# Patient Record
Sex: Female | Born: 1945 | Race: White | Hispanic: No
Health system: Southern US, Community
[De-identification: ages and names within clinical notes are randomized; demographics above are authoritative.]

---

## 2004-10-27 ENCOUNTER — Ambulatory Visit: Payer: Self-pay | Admitting: Internal Medicine

## 2004-11-08 ENCOUNTER — Ambulatory Visit: Payer: Self-pay | Admitting: Internal Medicine

## 2004-11-10 ENCOUNTER — Inpatient Hospital Stay (HOSPITAL_COMMUNITY): Admission: AD | Admit: 2004-11-10 | Discharge: 2004-11-11 | Payer: Self-pay | Admitting: Internal Medicine

## 2004-11-10 ENCOUNTER — Ambulatory Visit: Payer: Self-pay | Admitting: Internal Medicine

## 2004-12-04 ENCOUNTER — Ambulatory Visit: Payer: Self-pay

## 2004-12-11 ENCOUNTER — Ambulatory Visit (HOSPITAL_COMMUNITY): Admission: RE | Admit: 2004-12-11 | Discharge: 2004-12-12 | Payer: Self-pay | Admitting: Internal Medicine

## 2004-12-11 ENCOUNTER — Ambulatory Visit: Payer: Self-pay | Admitting: Internal Medicine

## 2004-12-20 ENCOUNTER — Ambulatory Visit: Payer: Self-pay

## 2005-01-29 ENCOUNTER — Ambulatory Visit: Payer: Self-pay

## 2005-03-04 ENCOUNTER — Ambulatory Visit: Payer: Self-pay | Admitting: Internal Medicine

## 2005-03-06 ENCOUNTER — Ambulatory Visit: Payer: Self-pay | Admitting: Internal Medicine

## 2005-08-03 ENCOUNTER — Ambulatory Visit: Payer: Self-pay

## 2005-08-21 ENCOUNTER — Ambulatory Visit: Payer: Self-pay | Admitting: Family Medicine

## 2005-08-28 ENCOUNTER — Ambulatory Visit: Payer: Self-pay | Admitting: Family Medicine

## 2005-10-01 ENCOUNTER — Ambulatory Visit: Payer: Self-pay | Admitting: Internal Medicine

## 2006-02-26 ENCOUNTER — Ambulatory Visit: Payer: Self-pay | Admitting: Internal Medicine

## 2006-03-22 ENCOUNTER — Ambulatory Visit: Payer: Self-pay | Admitting: General Surgery

## 2006-05-30 ENCOUNTER — Ambulatory Visit: Payer: Self-pay | Admitting: Internal Medicine

## 2007-05-14 ENCOUNTER — Emergency Department: Payer: Self-pay | Admitting: Emergency Medicine

## 2007-05-14 ENCOUNTER — Other Ambulatory Visit: Payer: Self-pay

## 2007-12-02 ENCOUNTER — Emergency Department: Payer: Self-pay | Admitting: Emergency Medicine

## 2008-01-19 ENCOUNTER — Ambulatory Visit: Payer: Self-pay | Admitting: Family Medicine

## 2008-02-18 ENCOUNTER — Ambulatory Visit: Payer: Self-pay | Admitting: Gastroenterology

## 2008-06-01 ENCOUNTER — Ambulatory Visit: Payer: Self-pay | Admitting: Internal Medicine

## 2008-06-05 ENCOUNTER — Emergency Department: Payer: Self-pay | Admitting: Emergency Medicine

## 2008-09-18 ENCOUNTER — Emergency Department: Payer: Self-pay | Admitting: Emergency Medicine

## 2010-01-15 ENCOUNTER — Emergency Department: Payer: Self-pay | Admitting: Internal Medicine

## 2010-02-01 ENCOUNTER — Ambulatory Visit: Payer: Self-pay | Admitting: Cardiology

## 2010-02-03 ENCOUNTER — Inpatient Hospital Stay: Payer: Self-pay | Admitting: Cardiology

## 2010-06-08 ENCOUNTER — Ambulatory Visit: Payer: Self-pay | Admitting: Otolaryngology

## 2010-06-20 ENCOUNTER — Ambulatory Visit: Payer: Self-pay | Admitting: Otolaryngology

## 2010-06-22 ENCOUNTER — Ambulatory Visit: Payer: Self-pay | Admitting: Otolaryngology

## 2010-06-23 LAB — PATHOLOGY REPORT

## 2010-09-17 ENCOUNTER — Inpatient Hospital Stay: Payer: Self-pay | Admitting: Internal Medicine

## 2011-04-30 ENCOUNTER — Emergency Department: Payer: Self-pay | Admitting: Emergency Medicine

## 2011-07-12 ENCOUNTER — Emergency Department: Payer: Self-pay | Admitting: Emergency Medicine

## 2011-07-12 LAB — BASIC METABOLIC PANEL
BUN: 13 mg/dL (ref 7–18)
Creatinine: 0.84 mg/dL (ref 0.60–1.30)
Glucose: 103 mg/dL — ABNORMAL HIGH (ref 65–99)
Potassium: 4.1 mmol/L (ref 3.5–5.1)
Sodium: 141 mmol/L (ref 136–145)

## 2011-07-12 LAB — CBC
MCV: 86 fL (ref 80–100)
Platelet: 289 10*3/uL (ref 150–440)
RDW: 16.7 % — ABNORMAL HIGH (ref 11.5–14.5)
WBC: 8.1 10*3/uL (ref 3.6–11.0)

## 2011-11-11 ENCOUNTER — Emergency Department: Payer: Self-pay | Admitting: Internal Medicine

## 2011-11-11 LAB — CK TOTAL AND CKMB (NOT AT ARMC)
CK, Total: 31 U/L (ref 21–215)
CK-MB: 0.8 ng/mL (ref 0.5–3.6)

## 2011-11-11 LAB — COMPREHENSIVE METABOLIC PANEL
Albumin: 3.2 g/dL — ABNORMAL LOW (ref 3.4–5.0)
Anion Gap: 6 — ABNORMAL LOW (ref 7–16)
Bilirubin,Total: 0.2 mg/dL (ref 0.2–1.0)
Co2: 30 mmol/L (ref 21–32)
Osmolality: 273 (ref 275–301)
Total Protein: 7.1 g/dL (ref 6.4–8.2)

## 2011-11-11 LAB — CBC
HCT: 32.4 % — ABNORMAL LOW (ref 35.0–47.0)
MCHC: 31.1 g/dL — ABNORMAL LOW (ref 32.0–36.0)
MCV: 82 fL (ref 80–100)
Platelet: 286 10*3/uL (ref 150–440)
RBC: 3.94 10*6/uL (ref 3.80–5.20)

## 2011-11-11 LAB — PRO B NATRIURETIC PEPTIDE: B-Type Natriuretic Peptide: 36 pg/mL (ref 0–125)

## 2011-11-20 ENCOUNTER — Ambulatory Visit: Payer: Self-pay | Admitting: Internal Medicine

## 2012-02-21 ENCOUNTER — Ambulatory Visit: Payer: Self-pay | Admitting: Gastroenterology

## 2012-05-05 ENCOUNTER — Ambulatory Visit: Payer: Self-pay | Admitting: Oncology

## 2012-05-05 LAB — CBC CANCER CENTER
Eosinophil %: 1 %
Lymphocyte #: 2.5 x10 3/mm (ref 1.0–3.6)
MCH: 23.6 pg — ABNORMAL LOW (ref 26.0–34.0)
MCV: 77 fL — ABNORMAL LOW (ref 80–100)
Monocyte #: 0.8 x10 3/mm (ref 0.2–0.9)
Platelet: 417 x10 3/mm (ref 150–440)
RBC: 4.52 10*6/uL (ref 3.80–5.20)
RDW: 18.4 % — ABNORMAL HIGH (ref 11.5–14.5)

## 2012-05-05 LAB — IRON AND TIBC
Iron Bind.Cap.(Total): 484 ug/dL — ABNORMAL HIGH (ref 250–450)
Iron: 56 ug/dL (ref 50–170)
Unbound Iron-Bind.Cap.: 428 ug/dL

## 2012-05-05 LAB — FOLATE: Folic Acid: 29.6 ng/mL (ref 3.1–100.0)

## 2012-05-05 LAB — LACTATE DEHYDROGENASE: LDH: 182 U/L (ref 81–246)

## 2012-05-05 LAB — RETICULOCYTES: Reticulocyte: 2.09 % (ref 0.5–2.2)

## 2012-05-17 ENCOUNTER — Emergency Department: Payer: Self-pay | Admitting: Emergency Medicine

## 2012-05-17 LAB — BASIC METABOLIC PANEL
Anion Gap: 7 (ref 7–16)
BUN: 14 mg/dL (ref 7–18)
Calcium, Total: 8.9 mg/dL (ref 8.5–10.1)
EGFR (Non-African Amer.): >60
Osmolality: 271 (ref 275–301)
Potassium: 4.7 mmol/L (ref 3.5–5.1)

## 2012-05-17 LAB — CBC
HCT: 29.3 % — ABNORMAL LOW (ref 35.0–47.0)
HGB: 10.3 g/dL — ABNORMAL LOW (ref 12.0–16.0)
MCV: 78 fL — ABNORMAL LOW (ref 80–100)
RBC: 3.76 10*6/uL — ABNORMAL LOW (ref 3.80–5.20)
WBC: 7.7 10*3/uL (ref 3.6–11.0)

## 2012-05-21 ENCOUNTER — Ambulatory Visit: Payer: Self-pay | Admitting: Oncology

## 2012-06-21 ENCOUNTER — Ambulatory Visit: Payer: Self-pay | Admitting: Oncology

## 2012-06-30 ENCOUNTER — Ambulatory Visit: Payer: Self-pay | Admitting: Family Medicine

## 2012-07-09 ENCOUNTER — Ambulatory Visit: Payer: Self-pay | Admitting: Internal Medicine

## 2012-07-22 ENCOUNTER — Emergency Department: Payer: Self-pay | Admitting: Emergency Medicine

## 2012-07-22 LAB — BASIC METABOLIC PANEL
Anion Gap: 5 — ABNORMAL LOW (ref 7–16)
Calcium, Total: 9.4 mg/dL (ref 8.5–10.1)
Chloride: 102 mmol/L (ref 98–107)
EGFR (African American): >60
EGFR (Non-African Amer.): >60
Glucose: 110 mg/dL — ABNORMAL HIGH (ref 65–99)

## 2012-07-22 LAB — PRO B NATRIURETIC PEPTIDE: B-Type Natriuretic Peptide: 59 pg/mL (ref 0–125)

## 2012-07-22 LAB — CBC
HCT: 38.1 % (ref 35.0–47.0)
MCH: 28.2 pg (ref 26.0–34.0)
MCHC: 32.8 g/dL (ref 32.0–36.0)
Platelet: 301 10*3/uL (ref 150–440)
RBC: 4.42 10*6/uL (ref 3.80–5.20)

## 2012-07-22 LAB — PROTIME-INR
INR: 0.9
Prothrombin Time: 12.4 secs (ref 11.5–14.7)

## 2012-07-27 LAB — CULTURE, BLOOD (SINGLE)

## 2012-07-29 ENCOUNTER — Ambulatory Visit: Payer: Self-pay | Admitting: Internal Medicine

## 2012-08-19 ENCOUNTER — Ambulatory Visit: Payer: Self-pay | Admitting: Oncology

## 2012-09-01 LAB — CBC CANCER CENTER
Basophil #: 0.1 x10 3/mm (ref 0.0–0.1)
Basophil %: 0.6 %
Eosinophil #: 0.2 x10 3/mm (ref 0.0–0.7)
Eosinophil %: 1.8 %
HGB: 10.6 g/dL — ABNORMAL LOW (ref 12.0–16.0)
Lymphocyte #: 1.9 x10 3/mm (ref 1.0–3.6)
Lymphocyte %: 19.1 %
MCH: 28 pg (ref 26.0–34.0)
MCV: 86 fL (ref 80–100)
Monocyte %: 8.5 %
Neutrophil #: 7 x10 3/mm — ABNORMAL HIGH (ref 1.4–6.5)
Neutrophil %: 70 %
RBC: 3.78 10*6/uL — ABNORMAL LOW (ref 3.80–5.20)
RDW: 15.8 % — ABNORMAL HIGH (ref 11.5–14.5)
WBC: 10.1 x10 3/mm (ref 3.6–11.0)

## 2012-09-01 LAB — IRON AND TIBC: Iron Saturation: 5 %

## 2012-09-18 ENCOUNTER — Ambulatory Visit: Payer: Self-pay | Admitting: Oncology

## 2012-10-01 ENCOUNTER — Emergency Department: Payer: Self-pay | Admitting: Internal Medicine

## 2012-10-01 LAB — BASIC METABOLIC PANEL
Anion Gap: 3 — ABNORMAL LOW (ref 7–16)
BUN: 19 mg/dL — ABNORMAL HIGH (ref 7–18)
Calcium, Total: 10 mg/dL (ref 8.5–10.1)
Co2: 33 mmol/L — ABNORMAL HIGH (ref 21–32)
Creatinine: 0.6 mg/dL (ref 0.60–1.30)
EGFR (African American): >60
EGFR (Non-African Amer.): >60
Glucose: 95 mg/dL (ref 65–99)
Osmolality: 268 (ref 275–301)
Potassium: 4.5 mmol/L (ref 3.5–5.1)
Sodium: 133 mmol/L — ABNORMAL LOW (ref 136–145)

## 2012-10-01 LAB — CBC
HCT: 38.4 % (ref 35.0–47.0)
MCH: 29.7 pg (ref 26.0–34.0)
MCHC: 32.7 g/dL (ref 32.0–36.0)
MCV: 91 fL (ref 80–100)
RDW: 19.1 % — ABNORMAL HIGH (ref 11.5–14.5)
WBC: 8.2 10*3/uL (ref 3.6–11.0)

## 2012-10-01 LAB — TROPONIN I: Troponin-I: <0.02 ng/mL

## 2012-10-07 LAB — CULTURE, BLOOD (SINGLE)

## 2012-10-12 ENCOUNTER — Inpatient Hospital Stay: Payer: Self-pay | Admitting: Specialist

## 2012-10-12 LAB — COMPREHENSIVE METABOLIC PANEL
Albumin: 3.1 g/dL — ABNORMAL LOW (ref 3.4–5.0)
Alkaline Phosphatase: 243 U/L — ABNORMAL HIGH (ref 50–136)
Anion Gap: 3 — ABNORMAL LOW (ref 7–16)
Calcium, Total: 8.9 mg/dL (ref 8.5–10.1)
EGFR (African American): >60
EGFR (Non-African Amer.): >60
Glucose: 101 mg/dL — ABNORMAL HIGH (ref 65–99)
Osmolality: 261 (ref 275–301)
Potassium: 5.1 mmol/L (ref 3.5–5.1)
Sodium: 127 mmol/L — ABNORMAL LOW (ref 136–145)
Total Protein: 6.8 g/dL (ref 6.4–8.2)

## 2012-10-12 LAB — CBC
HCT: 37.7 % (ref 35.0–47.0)
HGB: 12.4 g/dL (ref 12.0–16.0)
MCH: 30.2 pg (ref 26.0–34.0)
MCHC: 33 g/dL (ref 32.0–36.0)
RDW: 17.9 % — ABNORMAL HIGH (ref 11.5–14.5)
WBC: 9.7 10*3/uL (ref 3.6–11.0)

## 2012-10-12 LAB — HEMOGLOBIN: HGB: 11.6 g/dL — ABNORMAL LOW (ref 12.0–16.0)

## 2012-10-12 LAB — PROTIME-INR: INR: 1

## 2012-10-12 LAB — TROPONIN I: Troponin-I: <0.02 ng/mL

## 2012-10-13 LAB — URINALYSIS, COMPLETE
Blood: NEGATIVE
Ketone: NEGATIVE
Leukocyte Esterase: NEGATIVE
Nitrite: NEGATIVE
Ph: 6 (ref 4.5–8.0)
Protein: NEGATIVE

## 2012-10-13 LAB — HEPATIC FUNCTION PANEL A (ARMC)
Alkaline Phosphatase: 251 U/L — ABNORMAL HIGH (ref 50–136)
Bilirubin, Direct: 0.2 mg/dL (ref 0.00–0.20)
SGPT (ALT): 255 U/L — ABNORMAL HIGH (ref 12–78)

## 2012-10-14 LAB — CBC WITH DIFFERENTIAL/PLATELET
Basophil #: 0 10*3/uL (ref 0.0–0.1)
Basophil %: 0.1 %
Eosinophil #: 0 10*3/uL (ref 0.0–0.7)
HGB: 10.8 g/dL — ABNORMAL LOW (ref 12.0–16.0)
Lymphocyte #: 0.6 10*3/uL — ABNORMAL LOW (ref 1.0–3.6)
MCH: 30.9 pg (ref 26.0–34.0)
MCHC: 34.2 g/dL (ref 32.0–36.0)
MCV: 90 fL (ref 80–100)
Monocyte #: 0.3 x10 3/mm (ref 0.2–0.9)
Neutrophil %: 92.2 %
RDW: 18 % — ABNORMAL HIGH (ref 11.5–14.5)

## 2012-10-14 LAB — COMPREHENSIVE METABOLIC PANEL
BUN: 20 mg/dL — ABNORMAL HIGH (ref 7–18)
Bilirubin,Total: 0.2 mg/dL (ref 0.2–1.0)
Chloride: 91 mmol/L — ABNORMAL LOW (ref 98–107)
Co2: 28 mmol/L (ref 21–32)
Creatinine: 0.68 mg/dL (ref 0.60–1.30)
EGFR (African American): >60
EGFR (Non-African Amer.): >60
Glucose: 139 mg/dL — ABNORMAL HIGH (ref 65–99)
Osmolality: 256 (ref 275–301)
Potassium: 4.7 mmol/L (ref 3.5–5.1)
SGPT (ALT): 168 U/L — ABNORMAL HIGH (ref 12–78)
Sodium: 125 mmol/L — ABNORMAL LOW (ref 136–145)
Total Protein: 6.3 g/dL — ABNORMAL LOW (ref 6.4–8.2)

## 2012-10-14 LAB — OSMOLALITY, URINE: Osmolality: 196 mOsm/kg

## 2012-10-14 LAB — URIC ACID: Uric Acid: 4.4 mg/dL (ref 2.6–6.0)

## 2012-10-14 LAB — OSMOLALITY: Osmolality: 268 mOsm/kg — ABNORMAL LOW (ref 280–301)

## 2012-10-15 LAB — BASIC METABOLIC PANEL
Anion Gap: 5 — ABNORMAL LOW (ref 7–16)
Calcium, Total: 8.5 mg/dL (ref 8.5–10.1)
Chloride: 93 mmol/L — ABNORMAL LOW (ref 98–107)
Co2: 29 mmol/L (ref 21–32)
Creatinine: 0.69 mg/dL (ref 0.60–1.30)
EGFR (Non-African Amer.): >60
Potassium: 4.1 mmol/L (ref 3.5–5.1)
Sodium: 127 mmol/L — ABNORMAL LOW (ref 136–145)

## 2012-10-18 LAB — EXPECTORATED SPUTUM ASSESSMENT W GRAM STAIN, RFLX TO RESP C

## 2012-11-03 ENCOUNTER — Ambulatory Visit: Payer: Self-pay | Admitting: Otolaryngology

## 2012-11-03 LAB — CBC WITH DIFFERENTIAL/PLATELET
Basophil #: 0 10*3/uL (ref 0.0–0.1)
Basophil %: 0.8 %
Eosinophil #: 0.3 10*3/uL (ref 0.0–0.7)
Eosinophil %: 4.8 %
HCT: 41.2 % (ref 35.0–47.0)
Lymphocyte %: 29.1 %
MCHC: 33.1 g/dL (ref 32.0–36.0)
Monocyte #: 0.6 x10 3/mm (ref 0.2–0.9)
Monocyte %: 9.8 %
Neutrophil #: 3.5 10*3/uL (ref 1.4–6.5)
Neutrophil %: 55.5 %
Platelet: 300 10*3/uL (ref 150–440)
RBC: 4.55 10*6/uL (ref 3.80–5.20)
RDW: 16.7 % — ABNORMAL HIGH (ref 11.5–14.5)
WBC: 6.2 10*3/uL (ref 3.6–11.0)

## 2012-11-03 LAB — BASIC METABOLIC PANEL
Anion Gap: 5 — ABNORMAL LOW (ref 7–16)
BUN: 11 mg/dL (ref 7–18)
Calcium, Total: 10 mg/dL (ref 8.5–10.1)
EGFR (African American): >60
EGFR (Non-African Amer.): >60
Glucose: 81 mg/dL (ref 65–99)
Osmolality: 263 (ref 275–301)
Potassium: 4.3 mmol/L (ref 3.5–5.1)
Sodium: 132 mmol/L — ABNORMAL LOW (ref 136–145)

## 2012-11-04 ENCOUNTER — Ambulatory Visit: Payer: Self-pay | Admitting: Nephrology

## 2012-11-05 ENCOUNTER — Ambulatory Visit: Payer: Self-pay | Admitting: Otolaryngology

## 2012-11-07 ENCOUNTER — Inpatient Hospital Stay: Payer: Self-pay | Admitting: Internal Medicine

## 2012-11-07 LAB — URINALYSIS, COMPLETE
Bacteria: NONE SEEN
Blood: NEGATIVE
Ketone: NEGATIVE
Leukocyte Esterase: NEGATIVE
Nitrite: NEGATIVE
Protein: NEGATIVE
WBC UR: 1 /HPF (ref 0–5)

## 2012-11-07 LAB — BASIC METABOLIC PANEL
BUN: 11 mg/dL (ref 7–18)
Creatinine: 0.68 mg/dL (ref 0.60–1.30)
EGFR (African American): >60
EGFR (Non-African Amer.): >60
Glucose: 102 mg/dL — ABNORMAL HIGH (ref 65–99)
Osmolality: 260 (ref 275–301)

## 2012-11-07 LAB — CK TOTAL AND CKMB (NOT AT ARMC): CK, Total: 30 U/L (ref 21–215)

## 2012-11-07 LAB — CBC
HCT: 39.3 % (ref 35.0–47.0)
HGB: 13.1 g/dL (ref 12.0–16.0)
MCH: 30.4 pg (ref 26.0–34.0)
MCHC: 33.4 g/dL (ref 32.0–36.0)
MCV: 91 fL (ref 80–100)
RBC: 4.31 10*6/uL (ref 3.80–5.20)
RDW: 16.1 % — ABNORMAL HIGH (ref 11.5–14.5)
WBC: 17.4 10*3/uL — ABNORMAL HIGH (ref 3.6–11.0)

## 2012-11-08 LAB — CBC WITH DIFFERENTIAL/PLATELET
HCT: 37.3 % (ref 35.0–47.0)
HGB: 12.3 g/dL (ref 12.0–16.0)
Lymphocyte #: 0.4 10*3/uL — ABNORMAL LOW (ref 1.0–3.6)
Lymphocyte %: 3.5 %
MCHC: 33.1 g/dL (ref 32.0–36.0)
MCV: 91 fL (ref 80–100)
Monocyte #: 0.2 x10 3/mm (ref 0.2–0.9)
Monocyte %: 1.3 %
Neutrophil #: 12.3 10*3/uL — ABNORMAL HIGH (ref 1.4–6.5)
Platelet: 211 10*3/uL (ref 150–440)
RBC: 4.08 10*6/uL (ref 3.80–5.20)
RDW: 16.2 % — ABNORMAL HIGH (ref 11.5–14.5)
WBC: 12.9 10*3/uL — ABNORMAL HIGH (ref 3.6–11.0)

## 2012-11-08 LAB — BASIC METABOLIC PANEL
BUN: 13 mg/dL (ref 7–18)
Chloride: 99 mmol/L (ref 98–107)
Creatinine: 0.67 mg/dL (ref 0.60–1.30)
EGFR (Non-African Amer.): >60
Glucose: 170 mg/dL — ABNORMAL HIGH (ref 65–99)
Osmolality: 276 (ref 275–301)

## 2012-11-08 LAB — TROPONIN I: Troponin-I: <0.02 ng/mL

## 2012-11-08 LAB — LIPID PANEL
HDL Cholesterol: 102 mg/dL — ABNORMAL HIGH (ref 40–60)
Ldl Cholesterol, Calc: 35 mg/dL (ref 0–100)
Triglycerides: 38 mg/dL (ref 0–200)
VLDL Cholesterol, Calc: 8 mg/dL (ref 5–40)

## 2012-11-09 LAB — BASIC METABOLIC PANEL
Anion Gap: 5 — ABNORMAL LOW (ref 7–16)
Calcium, Total: 8.7 mg/dL (ref 8.5–10.1)
Chloride: 101 mmol/L (ref 98–107)
EGFR (Non-African Amer.): >60
Glucose: 185 mg/dL — ABNORMAL HIGH (ref 65–99)
Osmolality: 278 (ref 275–301)

## 2012-11-09 LAB — CK: CK, Total: 44 U/L (ref 21–215)

## 2012-11-10 LAB — CBC WITH DIFFERENTIAL/PLATELET
Basophil %: 0.1 %
Eosinophil %: 0.1 %
Lymphocyte #: 0.7 10*3/uL — ABNORMAL LOW (ref 1.0–3.6)
Lymphocyte %: 4.9 %
MCHC: 32.9 g/dL (ref 32.0–36.0)
Monocyte #: 0.6 x10 3/mm (ref 0.2–0.9)
Monocyte %: 4.4 %
Neutrophil #: 12 10*3/uL — ABNORMAL HIGH (ref 1.4–6.5)
Neutrophil %: 90.5 %
Platelet: 278 10*3/uL (ref 150–440)

## 2012-11-10 LAB — BASIC METABOLIC PANEL
Anion Gap: 3 — ABNORMAL LOW (ref 7–16)
Calcium, Total: 8.8 mg/dL (ref 8.5–10.1)
Chloride: 102 mmol/L (ref 98–107)
Creatinine: 0.68 mg/dL (ref 0.60–1.30)
EGFR (African American): >60
Osmolality: 275 (ref 275–301)
Sodium: 137 mmol/L (ref 136–145)

## 2012-11-10 LAB — CK TOTAL AND CKMB (NOT AT ARMC)
CK, Total: 29 U/L (ref 21–215)
CK, Total: 26 U/L (ref 21–215)
CK-MB: 2.7 ng/mL (ref 0.5–3.6)

## 2012-11-11 LAB — CBC WITH DIFFERENTIAL/PLATELET
Basophil #: 0 10*3/uL (ref 0.0–0.1)
Basophil %: 0.1 %
Eosinophil %: 2.7 %
HCT: 36.7 % (ref 35.0–47.0)
Lymphocyte %: 20.1 %
MCH: 30.4 pg (ref 26.0–34.0)
MCHC: 33.8 g/dL (ref 32.0–36.0)
Monocyte #: 1 x10 3/mm — ABNORMAL HIGH (ref 0.2–0.9)
Neutrophil #: 6.2 10*3/uL (ref 1.4–6.5)
Neutrophil %: 66.8 %
RBC: 4.07 10*6/uL (ref 3.80–5.20)
RDW: 16.3 % — ABNORMAL HIGH (ref 11.5–14.5)
WBC: 9.3 10*3/uL (ref 3.6–11.0)

## 2012-11-13 LAB — CULTURE, BLOOD (SINGLE)

## 2012-11-18 ENCOUNTER — Ambulatory Visit: Payer: Self-pay | Admitting: Oncology

## 2012-11-26 ENCOUNTER — Ambulatory Visit: Payer: Self-pay | Admitting: Internal Medicine

## 2012-11-26 ENCOUNTER — Inpatient Hospital Stay: Payer: Self-pay | Admitting: Internal Medicine

## 2012-11-26 LAB — COMPREHENSIVE METABOLIC PANEL
Alkaline Phosphatase: 100 U/L (ref 50–136)
Anion Gap: 10 (ref 7–16)
EGFR (African American): >60
Glucose: 108 mg/dL — ABNORMAL HIGH (ref 65–99)
SGPT (ALT): 79 U/L — ABNORMAL HIGH (ref 12–78)

## 2012-11-26 LAB — CBC
HCT: 39.6 % (ref 35.0–47.0)
HGB: 12.9 g/dL (ref 12.0–16.0)
MCHC: 32.6 g/dL (ref 32.0–36.0)
MCV: 91 fL (ref 80–100)
RDW: 16.4 % — ABNORMAL HIGH (ref 11.5–14.5)

## 2012-11-26 LAB — URINALYSIS, COMPLETE
Ketone: NEGATIVE
Leukocyte Esterase: NEGATIVE
Nitrite: NEGATIVE
Ph: 6 (ref 4.5–8.0)
Specific Gravity: 1.01 (ref 1.003–1.030)
Squamous Epithelial: <1
WBC UR: 1 /HPF (ref 0–5)

## 2012-11-26 LAB — CK TOTAL AND CKMB (NOT AT ARMC): CK, Total: 27 U/L (ref 21–215)

## 2012-11-27 LAB — CBC WITH DIFFERENTIAL/PLATELET
HCT: 36.7 % (ref 35.0–47.0)
Lymphocyte #: 3.2 10*3/uL (ref 1.0–3.6)
Lymphocyte %: 29.1 %
Monocyte %: 6.9 %
Neutrophil #: 6.7 10*3/uL — ABNORMAL HIGH (ref 1.4–6.5)
RBC: 4.06 10*6/uL (ref 3.80–5.20)
RDW: 16.2 % — ABNORMAL HIGH (ref 11.5–14.5)
WBC: 11 10*3/uL (ref 3.6–11.0)

## 2012-11-27 LAB — BASIC METABOLIC PANEL
Anion Gap: 10 (ref 7–16)
Calcium, Total: 8.8 mg/dL (ref 8.5–10.1)
Creatinine: 0.73 mg/dL (ref 0.60–1.30)
EGFR (African American): >60
Glucose: 99 mg/dL (ref 65–99)
Osmolality: 274 (ref 275–301)
Sodium: 135 mmol/L — ABNORMAL LOW (ref 136–145)

## 2012-11-27 LAB — SEDIMENTATION RATE: Erythrocyte Sed Rate: 10 mm/hr (ref 0–30)

## 2012-11-27 LAB — CK TOTAL AND CKMB (NOT AT ARMC)
CK, Total: 21 U/L (ref 21–215)
CK-MB: 1.8 ng/mL (ref 0.5–3.6)
CK-MB: 1.8 ng/mL (ref 0.5–3.6)

## 2012-11-27 LAB — TROPONIN I: Troponin-I: 0.03 ng/mL

## 2012-11-27 LAB — HEMOGLOBIN A1C: Hemoglobin A1C: 6 % (ref 4.2–6.3)

## 2012-11-27 LAB — TSH: Thyroid Stimulating Horm: 1.19 u[IU]/mL

## 2012-11-28 LAB — CBC WITH DIFFERENTIAL/PLATELET
Basophil %: 1 %
Eosinophil %: 3.6 %
HGB: 11.7 g/dL — ABNORMAL LOW (ref 12.0–16.0)
Lymphocyte %: 35.2 %
MCH: 30.1 pg (ref 26.0–34.0)
MCHC: 33.1 g/dL (ref 32.0–36.0)
MCV: 91 fL (ref 80–100)
Neutrophil #: 4.3 10*3/uL (ref 1.4–6.5)
Neutrophil %: 52.6 %
WBC: 8.1 10*3/uL (ref 3.6–11.0)

## 2012-11-28 LAB — BASIC METABOLIC PANEL
Anion Gap: 8 (ref 7–16)
BUN: 19 mg/dL — ABNORMAL HIGH (ref 7–18)
Co2: 32 mmol/L (ref 21–32)
EGFR (African American): >60
EGFR (Non-African Amer.): >60
Osmolality: 272 (ref 275–301)
Potassium: 3.9 mmol/L (ref 3.5–5.1)
Sodium: 135 mmol/L — ABNORMAL LOW (ref 136–145)

## 2012-11-29 LAB — LIPID PANEL
Cholesterol: 198 mg/dL (ref 0–200)
HDL Cholesterol: 111 mg/dL — ABNORMAL HIGH (ref 40–60)
Ldl Cholesterol, Calc: 75 mg/dL (ref 0–100)
Triglycerides: 58 mg/dL (ref 0–200)

## 2012-12-04 ENCOUNTER — Other Ambulatory Visit: Payer: Self-pay | Admitting: Internal Medicine

## 2012-12-04 ENCOUNTER — Ambulatory Visit: Payer: Self-pay | Admitting: Urology

## 2012-12-04 DIAGNOSIS — R1310 Dysphagia, unspecified: Secondary | ICD-10-CM

## 2012-12-08 ENCOUNTER — Ambulatory Visit: Payer: Self-pay | Admitting: Oncology

## 2012-12-08 LAB — CBC CANCER CENTER
Basophil #: 0.1 "x10 3/mm "
Basophil %: 0.5 %
Eosinophil #: 0.5 "x10 3/mm "
Eosinophil %: 5 %
HCT: 39.6 %
HGB: 13.5 g/dL
Lymphocyte %: 24.4 %
Lymphs Abs: 2.5 "x10 3/mm "
MCH: 30.9 pg
MCHC: 34 g/dL
MCV: 91 fL
Monocyte #: 0.7 "x10 3/mm "
Monocyte %: 6.6 %
Neutrophil #: 6.5 "x10 3/mm "
Neutrophil %: 63.5 %
Platelet: 312 "x10 3/mm "
RBC: 4.37 "x10 6/mm "
RDW: 14.6 % — ABNORMAL HIGH
WBC: 10.3 "x10 3/mm "

## 2012-12-08 LAB — IRON AND TIBC
Iron Bind.Cap.(Total): 371 ug/dL
Iron Saturation: 20 %
Iron: 74 ug/dL
Unbound Iron-Bind.Cap.: 297 ug/dL

## 2012-12-16 ENCOUNTER — Other Ambulatory Visit: Payer: Self-pay | Admitting: Internal Medicine

## 2012-12-16 ENCOUNTER — Ambulatory Visit
Admission: RE | Admit: 2012-12-16 | Discharge: 2012-12-16 | Disposition: A | Discharge status: Home / Self Care | Payer: Medicare Other | Source: Ambulatory Visit | Attending: Internal Medicine | Admitting: Internal Medicine

## 2012-12-16 DIAGNOSIS — R1310 Dysphagia, unspecified: Secondary | ICD-10-CM

## 2012-12-19 ENCOUNTER — Ambulatory Visit: Payer: Self-pay | Admitting: Oncology

## 2012-12-22 ENCOUNTER — Ambulatory Visit: Payer: Self-pay | Admitting: Internal Medicine

## 2012-12-25 ENCOUNTER — Inpatient Hospital Stay: Payer: Self-pay | Admitting: Internal Medicine

## 2012-12-25 LAB — COMPREHENSIVE METABOLIC PANEL
Albumin: 3.1 g/dL — ABNORMAL LOW (ref 3.4–5.0)
Anion Gap: 7 (ref 7–16)
BUN: 19 mg/dL — ABNORMAL HIGH (ref 7–18)
Bilirubin,Total: 0.2 mg/dL (ref 0.2–1.0)
Calcium, Total: 9.3 mg/dL (ref 8.5–10.1)
Chloride: 95 mmol/L — ABNORMAL LOW (ref 98–107)
Creatinine: 0.83 mg/dL (ref 0.60–1.30)
EGFR (Non-African Amer.): >60
Glucose: 148 mg/dL — ABNORMAL HIGH (ref 65–99)
Osmolality: 270 (ref 275–301)
Potassium: 4.3 mmol/L (ref 3.5–5.1)
Total Protein: 7.4 g/dL (ref 6.4–8.2)

## 2012-12-25 LAB — CBC
HCT: 37 % (ref 35.0–47.0)
HGB: 12.2 g/dL (ref 12.0–16.0)
MCH: 29.3 pg (ref 26.0–34.0)
MCHC: 32.9 g/dL (ref 32.0–36.0)
MCV: 89 fL (ref 80–100)
Platelet: 438 10*3/uL (ref 150–440)
RDW: 14.2 % (ref 11.5–14.5)
WBC: 15.9 10*3/uL — ABNORMAL HIGH (ref 3.6–11.0)

## 2012-12-25 LAB — URINALYSIS, COMPLETE
Blood: NEGATIVE
Glucose,UR: NEGATIVE mg/dL (ref 0–75)
Ketone: NEGATIVE
Leukocyte Esterase: NEGATIVE
Nitrite: NEGATIVE
Ph: 7 (ref 4.5–8.0)
RBC,UR: 1 /HPF (ref 0–5)
Specific Gravity: 1.033 (ref 1.003–1.030)
Squamous Epithelial: NONE SEEN
WBC UR: <1 /HPF (ref 0–5)

## 2012-12-25 LAB — TROPONIN I: Troponin-I: <0.02 ng/mL

## 2012-12-25 LAB — PRO B NATRIURETIC PEPTIDE: B-Type Natriuretic Peptide: 34 pg/mL (ref 0–125)

## 2012-12-26 LAB — CBC WITH DIFFERENTIAL/PLATELET
Basophil %: 0.1 %
Eosinophil #: 0 10*3/uL (ref 0.0–0.7)
Eosinophil %: 0.2 %
HGB: 11.9 g/dL — ABNORMAL LOW (ref 12.0–16.0)
Lymphocyte #: 0.8 10*3/uL — ABNORMAL LOW (ref 1.0–3.6)
MCV: 89 fL (ref 80–100)
Monocyte #: 0.1 x10 3/mm — ABNORMAL LOW (ref 0.2–0.9)
Monocyte %: 1.1 %
Neutrophil #: 10.7 10*3/uL — ABNORMAL HIGH (ref 1.4–6.5)
Neutrophil %: 92 %
Platelet: 374 10*3/uL (ref 150–440)

## 2012-12-27 LAB — CREATININE, SERUM: Creatinine: 0.72 mg/dL (ref 0.60–1.30)

## 2012-12-28 LAB — HEPATIC FUNCTION PANEL A (ARMC)
Alkaline Phosphatase: 92 U/L (ref 50–136)
Bilirubin, Direct: <0.1 mg/dL (ref 0.00–0.20)
SGOT(AST): 37 U/L (ref 15–37)

## 2012-12-28 LAB — WBC: WBC: 11.3 10*3/uL — ABNORMAL HIGH (ref 3.6–11.0)

## 2012-12-28 LAB — SODIUM: Sodium: 133 mmol/L — ABNORMAL LOW (ref 136–145)

## 2012-12-30 LAB — CULTURE, BLOOD (SINGLE)

## 2012-12-30 LAB — SODIUM: Sodium: 135 mmol/L — ABNORMAL LOW (ref 136–145)

## 2012-12-31 LAB — CREATININE, SERUM: Creatinine: 0.55 mg/dL — ABNORMAL LOW (ref 0.60–1.30)

## 2013-01-01 ENCOUNTER — Ambulatory Visit: Payer: Self-pay | Admitting: Oncology

## 2013-02-08 ENCOUNTER — Inpatient Hospital Stay: Payer: Self-pay | Admitting: Internal Medicine

## 2013-02-08 LAB — BASIC METABOLIC PANEL
Anion Gap: 12 (ref 7–16)
Calcium, Total: 9 mg/dL (ref 8.5–10.1)
Co2: 34 mmol/L — ABNORMAL HIGH (ref 21–32)
Creatinine: 1.31 mg/dL — ABNORMAL HIGH (ref 0.60–1.30)
Osmolality: 250 (ref 275–301)
Potassium: 3 mmol/L — ABNORMAL LOW (ref 3.5–5.1)
Sodium: 117 mmol/L — CL (ref 136–145)

## 2013-02-08 LAB — URINALYSIS, COMPLETE
Bilirubin,UR: NEGATIVE
Blood: NEGATIVE
Glucose,UR: NEGATIVE mg/dL (ref 0–75)
Hyaline Cast: 66
Leukocyte Esterase: NEGATIVE
Ph: 5 (ref 4.5–8.0)
Protein: NEGATIVE
RBC,UR: 1 /HPF (ref 0–5)
Transitional Epi: 1

## 2013-02-08 LAB — CBC
MCH: 26.7 pg (ref 26.0–34.0)
MCHC: 33.1 g/dL (ref 32.0–36.0)
RDW: 16 % — ABNORMAL HIGH (ref 11.5–14.5)
WBC: 12.9 10*3/uL — ABNORMAL HIGH (ref 3.6–11.0)

## 2013-02-08 LAB — DIGOXIN LEVEL: Digoxin: 1.02 ng/mL

## 2013-02-09 LAB — CBC WITH DIFFERENTIAL/PLATELET
Eosinophil %: 1.1 %
Lymphocyte %: 11.1 %
MCHC: 33 g/dL (ref 32.0–36.0)
Monocyte %: 8.4 %
Neutrophil %: 79.1 %
Platelet: 351 10*3/uL (ref 150–440)
RDW: 16 % — ABNORMAL HIGH (ref 11.5–14.5)

## 2013-02-09 LAB — BASIC METABOLIC PANEL
Chloride: 75 mmol/L — ABNORMAL LOW (ref 98–107)
Co2: 36 mmol/L — ABNORMAL HIGH (ref 21–32)
Creatinine: 1.2 mg/dL (ref 0.60–1.30)
Glucose: 131 mg/dL — ABNORMAL HIGH (ref 65–99)

## 2013-02-09 LAB — SODIUM: Sodium: 121 mmol/L — ABNORMAL LOW (ref 136–145)

## 2013-02-10 LAB — BASIC METABOLIC PANEL
Anion Gap: 6 — ABNORMAL LOW (ref 7–16)
BUN: 10 mg/dL (ref 7–18)
Creatinine: 0.87 mg/dL (ref 0.60–1.30)
EGFR (Non-African Amer.): >60
Potassium: 3.5 mmol/L (ref 3.5–5.1)
Sodium: 125 mmol/L — ABNORMAL LOW (ref 136–145)

## 2013-02-10 LAB — CBC WITH DIFFERENTIAL/PLATELET
Eosinophil #: 0 10*3/uL (ref 0.0–0.7)
Eosinophil %: 0.2 %
HCT: 21.4 % — ABNORMAL LOW (ref 35.0–47.0)
MCH: 26.7 pg (ref 26.0–34.0)
MCHC: 33 g/dL (ref 32.0–36.0)
MCV: 81 fL (ref 80–100)
Monocyte %: 9.8 %
Neutrophil #: 8.9 10*3/uL — ABNORMAL HIGH (ref 1.4–6.5)
RBC: 2.65 10*6/uL — ABNORMAL LOW (ref 3.80–5.20)
RDW: 15.9 % — ABNORMAL HIGH (ref 11.5–14.5)

## 2013-02-10 LAB — IRON AND TIBC
Iron Bind.Cap.(Total): 379 ug/dL (ref 250–450)
Iron Saturation: 6 %
Unbound Iron-Bind.Cap.: 355 ug/dL

## 2013-02-11 LAB — CBC WITH DIFFERENTIAL/PLATELET
Basophil #: 0 10*3/uL (ref 0.0–0.1)
Basophil %: 0.3 %
Eosinophil #: 0 10*3/uL (ref 0.0–0.7)
Eosinophil %: 0.2 %
HCT: 21 % — ABNORMAL LOW (ref 35.0–47.0)
HGB: 7.1 g/dL — ABNORMAL LOW (ref 12.0–16.0)
Lymphocyte #: 1.3 10*3/uL (ref 1.0–3.6)
Lymphocyte %: 13.8 %
MCHC: 33.9 g/dL (ref 32.0–36.0)
MCV: 81 fL (ref 80–100)
Monocyte #: 0.8 x10 3/mm (ref 0.2–0.9)
Neutrophil %: 77.7 %
Platelet: 377 10*3/uL (ref 150–440)
RBC: 2.58 10*6/uL — ABNORMAL LOW (ref 3.80–5.20)
WBC: 9.5 10*3/uL (ref 3.6–11.0)

## 2013-02-11 LAB — BASIC METABOLIC PANEL
Anion Gap: 2 — ABNORMAL LOW (ref 7–16)
EGFR (African American): >60
Potassium: 3.9 mmol/L (ref 3.5–5.1)

## 2013-02-11 LAB — MAGNESIUM: Magnesium: 1.7 mg/dL — ABNORMAL LOW

## 2013-02-12 LAB — BASIC METABOLIC PANEL
BUN: 10 mg/dL (ref 7–18)
Chloride: 93 mmol/L — ABNORMAL LOW (ref 98–107)
Co2: 33 mmol/L — ABNORMAL HIGH (ref 21–32)
Creatinine: 0.81 mg/dL (ref 0.60–1.30)
EGFR (African American): >60
EGFR (Non-African Amer.): >60
Osmolality: 259 (ref 275–301)
Potassium: 3.8 mmol/L (ref 3.5–5.1)
Sodium: 130 mmol/L — ABNORMAL LOW (ref 136–145)

## 2013-02-12 LAB — CBC WITH DIFFERENTIAL/PLATELET
Basophil #: 0 10*3/uL (ref 0.0–0.1)
HCT: 21 % — ABNORMAL LOW (ref 35.0–47.0)
HGB: 6.8 g/dL — ABNORMAL LOW (ref 12.0–16.0)
Lymphocyte #: 2.3 10*3/uL (ref 1.0–3.6)
Lymphocyte %: 19.4 %
MCH: 26.5 pg (ref 26.0–34.0)
Monocyte #: 1 x10 3/mm — ABNORMAL HIGH (ref 0.2–0.9)
Neutrophil %: 70.6 %
RBC: 2.58 10*6/uL — ABNORMAL LOW (ref 3.80–5.20)
RDW: 16 % — ABNORMAL HIGH (ref 11.5–14.5)
WBC: 12 10*3/uL — ABNORMAL HIGH (ref 3.6–11.0)

## 2013-02-13 LAB — CULTURE, BLOOD (SINGLE)

## 2013-02-13 LAB — CBC WITH DIFFERENTIAL/PLATELET
Basophil #: 0 10*3/uL (ref 0.0–0.1)
Basophil %: 0.4 %
Eosinophil #: 0.3 10*3/uL (ref 0.0–0.7)
HCT: 23.8 % — ABNORMAL LOW (ref 35.0–47.0)
HGB: 7.9 g/dL — ABNORMAL LOW (ref 12.0–16.0)
Lymphocyte #: 2.4 10*3/uL (ref 1.0–3.6)
Lymphocyte %: 18.3 %
Monocyte #: 1.1 x10 3/mm — ABNORMAL HIGH (ref 0.2–0.9)
Neutrophil #: 9.5 10*3/uL — ABNORMAL HIGH (ref 1.4–6.5)
Neutrophil %: 71.1 %
Platelet: 402 10*3/uL (ref 150–440)
RBC: 2.92 10*6/uL — ABNORMAL LOW (ref 3.80–5.20)
WBC: 13.3 10*3/uL — ABNORMAL HIGH (ref 3.6–11.0)

## 2013-02-13 LAB — EXPECTORATED SPUTUM ASSESSMENT W GRAM STAIN, RFLX TO RESP C

## 2013-02-13 LAB — SODIUM, URINE, RANDOM: Sodium, Urine Random: 38 mmol/L (ref 20–110)

## 2013-02-14 LAB — CBC WITH DIFFERENTIAL/PLATELET
Basophil #: 0 10*3/uL (ref 0.0–0.1)
Eosinophil %: 0.3 %
HCT: 24.3 % — ABNORMAL LOW (ref 35.0–47.0)
Lymphocyte %: 9 %
MCHC: 33 g/dL (ref 32.0–36.0)
MCV: 83 fL (ref 80–100)
Monocyte #: 0.7 x10 3/mm (ref 0.2–0.9)
Neutrophil #: 9.5 10*3/uL — ABNORMAL HIGH (ref 1.4–6.5)
Platelet: 393 10*3/uL (ref 150–440)

## 2013-02-14 LAB — BASIC METABOLIC PANEL
Anion Gap: 5 — ABNORMAL LOW (ref 7–16)
Calcium, Total: 8.9 mg/dL (ref 8.5–10.1)
EGFR (Non-African Amer.): >60
Glucose: 132 mg/dL — ABNORMAL HIGH (ref 65–99)
Osmolality: 269 (ref 275–301)
Potassium: 4.5 mmol/L (ref 3.5–5.1)
Sodium: 134 mmol/L — ABNORMAL LOW (ref 136–145)

## 2013-02-15 LAB — CBC WITH DIFFERENTIAL/PLATELET
Basophil #: 0 10*3/uL (ref 0.0–0.1)
Eosinophil #: 0.1 10*3/uL (ref 0.0–0.7)
Eosinophil %: 1.1 %
HCT: 23.1 % — ABNORMAL LOW (ref 35.0–47.0)
Lymphocyte #: 1.5 10*3/uL (ref 1.0–3.6)
Lymphocyte %: 16.2 %
MCH: 27.7 pg (ref 26.0–34.0)
MCHC: 33.6 g/dL (ref 32.0–36.0)
MCV: 83 fL (ref 80–100)
Monocyte #: 0.7 x10 3/mm (ref 0.2–0.9)
Monocyte %: 7.1 %
Neutrophil #: 7.1 10*3/uL — ABNORMAL HIGH (ref 1.4–6.5)
Neutrophil %: 75.5 %
Platelet: 370 10*3/uL (ref 150–440)
RDW: 16.5 % — ABNORMAL HIGH (ref 11.5–14.5)

## 2013-02-17 LAB — PATHOLOGY REPORT

## 2013-02-26 ENCOUNTER — Inpatient Hospital Stay: Payer: Self-pay | Admitting: Family Medicine

## 2013-02-26 LAB — URINALYSIS, COMPLETE
Bacteria: NONE SEEN
Bilirubin,UR: NEGATIVE
Blood: NEGATIVE
Glucose,UR: NEGATIVE mg/dL (ref 0–75)
Ketone: NEGATIVE
Leukocyte Esterase: NEGATIVE
Nitrite: NEGATIVE
Ph: 5 (ref 4.5–8.0)
Protein: NEGATIVE
RBC,UR: 1 /HPF (ref 0–5)
Specific Gravity: 1.008 (ref 1.003–1.030)
WBC UR: 6 /HPF (ref 0–5)

## 2013-02-26 LAB — COMPREHENSIVE METABOLIC PANEL
Anion Gap: 9 (ref 7–16)
Calcium, Total: 8.6 mg/dL (ref 8.5–10.1)
Chloride: 96 mmol/L — ABNORMAL LOW (ref 98–107)
Co2: 30 mmol/L (ref 21–32)
Creatinine: 0.88 mg/dL (ref 0.60–1.30)
Glucose: 111 mg/dL — ABNORMAL HIGH (ref 65–99)
SGOT(AST): 99 U/L — ABNORMAL HIGH (ref 15–37)

## 2013-02-26 LAB — TROPONIN I: Troponin-I: <0.02 ng/mL

## 2013-02-26 LAB — TSH: Thyroid Stimulating Horm: 0.207 u[IU]/mL — ABNORMAL LOW

## 2013-02-26 LAB — CBC
HGB: 9.7 g/dL — ABNORMAL LOW (ref 12.0–16.0)
MCH: 27.4 pg (ref 26.0–34.0)
Platelet: 250 10*3/uL (ref 150–440)
RDW: 21.5 % — ABNORMAL HIGH (ref 11.5–14.5)
WBC: 13.1 10*3/uL — ABNORMAL HIGH (ref 3.6–11.0)

## 2013-02-26 LAB — PRO B NATRIURETIC PEPTIDE: B-Type Natriuretic Peptide: 384 pg/mL — ABNORMAL HIGH (ref 0–125)

## 2013-02-26 LAB — CK TOTAL AND CKMB (NOT AT ARMC)
CK, Total: 97 U/L (ref 21–215)
CK-MB: 0.5 ng/mL (ref 0.5–3.6)

## 2013-02-27 ENCOUNTER — Ambulatory Visit: Payer: Self-pay | Admitting: Internal Medicine

## 2013-02-27 LAB — COMPREHENSIVE METABOLIC PANEL
Alkaline Phosphatase: 183 U/L — ABNORMAL HIGH (ref 50–136)
Anion Gap: 6 — ABNORMAL LOW (ref 7–16)
Bilirubin,Total: 0.8 mg/dL (ref 0.2–1.0)
Co2: 31 mmol/L (ref 21–32)
Creatinine: 0.85 mg/dL (ref 0.60–1.30)
EGFR (African American): >60
EGFR (Non-African Amer.): >60
Osmolality: 273 (ref 275–301)

## 2013-02-27 LAB — CBC WITH DIFFERENTIAL/PLATELET
Basophil #: 0 10*3/uL (ref 0.0–0.1)
Basophil %: 0.2 %
Eosinophil #: 0.1 10*3/uL (ref 0.0–0.7)
HCT: 25.7 % — ABNORMAL LOW (ref 35.0–47.0)
HGB: 8.5 g/dL — ABNORMAL LOW (ref 12.0–16.0)
Lymphocyte #: 0.9 10*3/uL — ABNORMAL LOW (ref 1.0–3.6)
MCH: 27.8 pg (ref 26.0–34.0)
MCHC: 33 g/dL (ref 32.0–36.0)
MCV: 84 fL (ref 80–100)
Monocyte #: 0.7 x10 3/mm (ref 0.2–0.9)
Monocyte %: 8.5 %
Neutrophil #: 6.3 10*3/uL (ref 1.4–6.5)
Neutrophil %: 78.8 %
Platelet: 201 10*3/uL (ref 150–440)
RDW: 21.5 % — ABNORMAL HIGH (ref 11.5–14.5)
WBC: 8 10*3/uL (ref 3.6–11.0)

## 2013-02-27 LAB — MAGNESIUM: Magnesium: 0.8 mg/dL — ABNORMAL LOW

## 2013-02-27 LAB — PHOSPHORUS: Phosphorus: 3.4 mg/dL (ref 2.5–4.9)

## 2013-02-27 LAB — LIPID PANEL
Cholesterol: 108 mg/dL (ref 0–200)
HDL Cholesterol: 73 mg/dL — ABNORMAL HIGH (ref 40–60)
Ldl Cholesterol, Calc: 25 mg/dL (ref 0–100)
Triglycerides: 52 mg/dL (ref 0–200)

## 2013-02-27 LAB — PRO B NATRIURETIC PEPTIDE: B-Type Natriuretic Peptide: 414 pg/mL — ABNORMAL HIGH (ref 0–125)

## 2013-02-27 LAB — APTT: Activated PTT: 39.8 secs — ABNORMAL HIGH (ref 23.6–35.9)

## 2013-02-27 LAB — TROPONIN I
Troponin-I: <0.02 ng/mL
Troponin-I: <0.02 ng/mL

## 2013-02-27 LAB — PROTIME-INR: INR: 1.2

## 2013-02-28 LAB — CBC WITH DIFFERENTIAL/PLATELET
Basophil #: 0 10*3/uL (ref 0.0–0.1)
Basophil %: 0.6 %
Eosinophil %: 3.9 %
HGB: 8.6 g/dL — ABNORMAL LOW (ref 12.0–16.0)
Lymphocyte #: 1 10*3/uL (ref 1.0–3.6)
Lymphocyte %: 15.9 %
MCH: 27.5 pg (ref 26.0–34.0)
MCHC: 32.4 g/dL (ref 32.0–36.0)
MCV: 85 fL (ref 80–100)
Monocyte #: 0.7 x10 3/mm (ref 0.2–0.9)
Neutrophil #: 4.2 10*3/uL (ref 1.4–6.5)
Neutrophil %: 68.8 %
RBC: 3.14 10*6/uL — ABNORMAL LOW (ref 3.80–5.20)
RDW: 22.5 % — ABNORMAL HIGH (ref 11.5–14.5)
WBC: 6.1 10*3/uL (ref 3.6–11.0)

## 2013-02-28 LAB — URINALYSIS, COMPLETE
Bilirubin,UR: NEGATIVE
Blood: NEGATIVE
Glucose,UR: NEGATIVE mg/dL (ref 0–75)
Ketone: NEGATIVE
Nitrite: NEGATIVE
Ph: 6 (ref 4.5–8.0)
Protein: NEGATIVE
Specific Gravity: 1.015 (ref 1.003–1.030)
WBC UR: 2 /HPF (ref 0–5)

## 2013-02-28 LAB — BASIC METABOLIC PANEL
Anion Gap: 6 — ABNORMAL LOW (ref 7–16)
Calcium, Total: 7.9 mg/dL — ABNORMAL LOW (ref 8.5–10.1)
Chloride: 100 mmol/L (ref 98–107)
Co2: 32 mmol/L (ref 21–32)
EGFR (African American): >60
EGFR (Non-African Amer.): >60
Glucose: 88 mg/dL (ref 65–99)
Osmolality: 275 (ref 275–301)
Potassium: 2.7 mmol/L — ABNORMAL LOW (ref 3.5–5.1)

## 2013-02-28 LAB — DIGOXIN LEVEL: Digoxin: 0.74 ng/mL

## 2013-02-28 LAB — MAGNESIUM: Magnesium: 1.3 mg/dL — ABNORMAL LOW

## 2013-03-01 LAB — BASIC METABOLIC PANEL
Anion Gap: 5 — ABNORMAL LOW (ref 7–16)
BUN: 8 mg/dL (ref 7–18)
Chloride: 103 mmol/L (ref 98–107)
Co2: 30 mmol/L (ref 21–32)
EGFR (African American): >60
EGFR (Non-African Amer.): >60
Osmolality: 274 (ref 275–301)
Potassium: 3.2 mmol/L — ABNORMAL LOW (ref 3.5–5.1)
Sodium: 138 mmol/L (ref 136–145)

## 2013-03-02 LAB — CBC WITH DIFFERENTIAL/PLATELET
Basophil %: 1.3 %
HCT: 26.9 % — ABNORMAL LOW (ref 35.0–47.0)
HGB: 8.8 g/dL — ABNORMAL LOW (ref 12.0–16.0)
Lymphocyte %: 33.6 %
MCV: 85 fL (ref 80–100)
Monocyte #: 0.4 x10 3/mm (ref 0.2–0.9)
Neutrophil #: 1.7 10*3/uL (ref 1.4–6.5)
RBC: 3.17 10*6/uL — ABNORMAL LOW (ref 3.80–5.20)
RDW: 20.8 % — ABNORMAL HIGH (ref 11.5–14.5)

## 2013-03-02 LAB — BASIC METABOLIC PANEL
Anion Gap: 7 (ref 7–16)
BUN: 6 mg/dL — ABNORMAL LOW (ref 7–18)
Calcium, Total: 9.3 mg/dL (ref 8.5–10.1)
Chloride: 103 mmol/L (ref 98–107)
Co2: 30 mmol/L (ref 21–32)
Creatinine: 0.7 mg/dL (ref 0.60–1.30)
EGFR (Non-African Amer.): >60
Glucose: 83 mg/dL (ref 65–99)
Potassium: 3.7 mmol/L (ref 3.5–5.1)

## 2013-03-02 LAB — URINE CULTURE

## 2013-03-03 LAB — CULTURE, BLOOD (SINGLE)

## 2013-03-17 ENCOUNTER — Emergency Department: Payer: Self-pay | Admitting: Internal Medicine

## 2013-03-17 LAB — COMPREHENSIVE METABOLIC PANEL
Albumin: 3.1 g/dL — ABNORMAL LOW (ref 3.4–5.0)
Anion Gap: 1 — ABNORMAL LOW (ref 7–16)
Bilirubin,Total: 0.3 mg/dL (ref 0.2–1.0)
Chloride: 97 mmol/L — ABNORMAL LOW (ref 98–107)
Creatinine: 1.06 mg/dL (ref 0.60–1.30)
EGFR (Non-African Amer.): 55 — ABNORMAL LOW
SGOT(AST): 74 U/L — ABNORMAL HIGH (ref 15–37)
SGPT (ALT): 27 U/L (ref 12–78)

## 2013-03-17 LAB — PROTIME-INR
INR: 0.9
Prothrombin Time: 12.2 secs (ref 11.5–14.7)

## 2013-03-17 LAB — CBC
HCT: 33.7 % — ABNORMAL LOW (ref 35.0–47.0)
HGB: 10.9 g/dL — ABNORMAL LOW (ref 12.0–16.0)
MCV: 86 fL (ref 80–100)
Platelet: 271 10*3/uL (ref 150–440)
RDW: 20.8 % — ABNORMAL HIGH (ref 11.5–14.5)
WBC: 6.5 10*3/uL (ref 3.6–11.0)

## 2013-03-17 LAB — TROPONIN I: Troponin-I: <0.02 ng/mL

## 2013-03-21 ENCOUNTER — Ambulatory Visit: Payer: Self-pay | Admitting: Internal Medicine

## 2013-03-26 ENCOUNTER — Ambulatory Visit: Payer: Self-pay | Admitting: Internal Medicine

## 2013-04-06 ENCOUNTER — Inpatient Hospital Stay: Payer: Self-pay | Admitting: Internal Medicine

## 2013-04-06 LAB — BASIC METABOLIC PANEL
BUN: 7 mg/dL (ref 7–18)
Calcium, Total: 9.3 mg/dL (ref 8.5–10.1)
Co2: 30 mmol/L (ref 21–32)
Creatinine: 0.76 mg/dL (ref 0.60–1.30)
EGFR (Non-African Amer.): >60
Glucose: 111 mg/dL — ABNORMAL HIGH (ref 65–99)
Osmolality: 248 (ref 275–301)

## 2013-04-06 LAB — CBC
HCT: 42.6 % (ref 35.0–47.0)
HGB: 13.9 g/dL (ref 12.0–16.0)
MCH: 28.4 pg (ref 26.0–34.0)
MCHC: 32.5 g/dL (ref 32.0–36.0)
WBC: 11.5 10*3/uL — ABNORMAL HIGH (ref 3.6–11.0)

## 2013-04-06 LAB — URINALYSIS, COMPLETE
Bilirubin,UR: NEGATIVE
Glucose,UR: NEGATIVE mg/dL (ref 0–75)
Ketone: NEGATIVE
Nitrite: NEGATIVE
Ph: 6 (ref 4.5–8.0)
Protein: NEGATIVE
RBC,UR: 22 /HPF (ref 0–5)
Specific Gravity: 1.005 (ref 1.003–1.030)
WBC UR: 58 /HPF (ref 0–5)

## 2013-04-07 LAB — BASIC METABOLIC PANEL
Co2: 31 mmol/L (ref 21–32)
Creatinine: 1.01 mg/dL (ref 0.60–1.30)
EGFR (African American): >60
EGFR (Non-African Amer.): 58 — ABNORMAL LOW
Sodium: 128 mmol/L — ABNORMAL LOW (ref 136–145)

## 2013-04-07 LAB — OSMOLALITY, URINE: Osmolality: 105 mOsm/kg

## 2013-04-07 LAB — DIGOXIN LEVEL: Digoxin: 1.26 ng/mL

## 2013-04-08 LAB — URINE CULTURE

## 2013-04-17 ENCOUNTER — Emergency Department: Payer: Self-pay | Admitting: Emergency Medicine

## 2013-04-17 LAB — CBC
HGB: 13.3 g/dL (ref 12.0–16.0)
MCHC: 32.5 g/dL (ref 32.0–36.0)
Platelet: 287 10*3/uL (ref 150–440)
RBC: 4.66 10*6/uL (ref 3.80–5.20)
RDW: 18.9 % — ABNORMAL HIGH (ref 11.5–14.5)
WBC: 10.8 10*3/uL (ref 3.6–11.0)

## 2013-04-17 LAB — COMPREHENSIVE METABOLIC PANEL
Alkaline Phosphatase: 123 U/L — ABNORMAL HIGH
Bilirubin,Total: 0.3 mg/dL (ref 0.2–1.0)
Calcium, Total: 9.2 mg/dL (ref 8.5–10.1)
Chloride: 97 mmol/L — ABNORMAL LOW (ref 98–107)
Co2: 30 mmol/L (ref 21–32)
Creatinine: 0.66 mg/dL (ref 0.60–1.30)
EGFR (African American): >60
EGFR (Non-African Amer.): >60
Glucose: 103 mg/dL — ABNORMAL HIGH (ref 65–99)
Osmolality: 264 (ref 275–301)
Potassium: 3.9 mmol/L (ref 3.5–5.1)
SGOT(AST): 53 U/L — ABNORMAL HIGH (ref 15–37)
SGPT (ALT): 24 U/L (ref 12–78)
Sodium: 133 mmol/L — ABNORMAL LOW (ref 136–145)

## 2013-04-17 LAB — URINALYSIS, COMPLETE
Bilirubin,UR: NEGATIVE
Glucose,UR: NEGATIVE mg/dL (ref 0–75)
Ketone: NEGATIVE
Nitrite: NEGATIVE
Protein: NEGATIVE
RBC,UR: 5 /HPF (ref 0–5)
WBC UR: 31 /HPF (ref 0–5)

## 2013-04-26 ENCOUNTER — Emergency Department: Payer: Self-pay | Admitting: Emergency Medicine

## 2013-04-26 LAB — CBC WITH DIFFERENTIAL/PLATELET
Basophil %: 0.5 %
HCT: 39.3 % (ref 35.0–47.0)
HGB: 13.3 g/dL (ref 12.0–16.0)
Lymphocyte #: 1.7 10*3/uL (ref 1.0–3.6)
Lymphocyte %: 18.1 %
MCH: 28.9 pg (ref 26.0–34.0)
MCHC: 33.7 g/dL (ref 32.0–36.0)
MCV: 86 fL (ref 80–100)
Monocyte #: 0.8 x10 3/mm (ref 0.2–0.9)
Neutrophil #: 6.5 10*3/uL (ref 1.4–6.5)
Neutrophil %: 69.2 %
Platelet: 247 10*3/uL (ref 150–440)
WBC: 9.4 10*3/uL (ref 3.6–11.0)

## 2013-04-26 LAB — URINALYSIS, COMPLETE
Bilirubin,UR: NEGATIVE
Glucose,UR: NEGATIVE mg/dL (ref 0–75)
Nitrite: POSITIVE
Ph: 6 (ref 4.5–8.0)
RBC,UR: 64 /HPF (ref 0–5)
Squamous Epithelial: 6
WBC UR: 4515 /HPF (ref 0–5)

## 2013-04-26 LAB — COMPREHENSIVE METABOLIC PANEL
Bilirubin,Total: 0.3 mg/dL (ref 0.2–1.0)
Calcium, Total: 8.9 mg/dL (ref 8.5–10.1)
Chloride: 91 mmol/L — ABNORMAL LOW (ref 98–107)
EGFR (African American): >60
EGFR (Non-African Amer.): >60
SGOT(AST): 49 U/L — ABNORMAL HIGH (ref 15–37)
Sodium: 129 mmol/L — ABNORMAL LOW (ref 136–145)
Total Protein: 6.8 g/dL (ref 6.4–8.2)

## 2013-04-29 LAB — URINE CULTURE

## 2013-05-01 ENCOUNTER — Inpatient Hospital Stay: Payer: Self-pay | Admitting: Internal Medicine

## 2013-05-01 LAB — CBC
MCV: 87 fL (ref 80–100)
Platelet: 346 10*3/uL (ref 150–440)
RBC: 5.06 10*6/uL (ref 3.80–5.20)
WBC: 16.3 10*3/uL — ABNORMAL HIGH (ref 3.6–11.0)

## 2013-05-01 LAB — BASIC METABOLIC PANEL
Anion Gap: 7 (ref 7–16)
BUN: 9 mg/dL (ref 7–18)
Calcium, Total: 9.4 mg/dL (ref 8.5–10.1)
Creatinine: 0.63 mg/dL (ref 0.60–1.30)
EGFR (African American): >60
Glucose: 95 mg/dL (ref 65–99)
Osmolality: 246 (ref 275–301)
Sodium: 123 mmol/L — ABNORMAL LOW (ref 136–145)

## 2013-05-01 LAB — URINALYSIS, COMPLETE
Hyaline Cast: 39
Ketone: NEGATIVE
Nitrite: POSITIVE
Ph: 5 (ref 4.5–8.0)
Protein: 100
RBC,UR: 15 /HPF (ref 0–5)
Squamous Epithelial: 5
Transitional Epi: 2
WBC UR: 2980 /HPF (ref 0–5)

## 2013-05-01 LAB — CULTURE, BLOOD (SINGLE)

## 2013-05-01 LAB — TROPONIN I: Troponin-I: <0.02 ng/mL

## 2013-05-02 LAB — BASIC METABOLIC PANEL
Glucose: 84 mg/dL (ref 65–99)
Osmolality: 256 (ref 275–301)
Potassium: 3.4 mmol/L — ABNORMAL LOW (ref 3.5–5.1)

## 2013-05-02 LAB — CBC WITH DIFFERENTIAL/PLATELET
Eosinophil #: 0.7 10*3/uL (ref 0.0–0.7)
Eosinophil %: 7.1 %
HCT: 36.4 % (ref 35.0–47.0)
Lymphocyte %: 13.8 %
MCH: 28.5 pg (ref 26.0–34.0)
MCHC: 32.9 g/dL (ref 32.0–36.0)
Monocyte %: 7.5 %
Neutrophil #: 6.8 10*3/uL — ABNORMAL HIGH (ref 1.4–6.5)
Platelet: 290 10*3/uL (ref 150–440)
RBC: 4.2 10*6/uL (ref 3.80–5.20)
RDW: 16.6 % — ABNORMAL HIGH (ref 11.5–14.5)
WBC: 9.6 10*3/uL (ref 3.6–11.0)

## 2013-05-03 LAB — BASIC METABOLIC PANEL
Anion Gap: 5 — ABNORMAL LOW (ref 7–16)
BUN: 4 mg/dL — ABNORMAL LOW (ref 7–18)
Calcium, Total: 9.2 mg/dL (ref 8.5–10.1)
Chloride: 98 mmol/L (ref 98–107)
Co2: 31 mmol/L (ref 21–32)
EGFR (Non-African Amer.): >60
Glucose: 82 mg/dL (ref 65–99)
Sodium: 134 mmol/L — ABNORMAL LOW (ref 136–145)

## 2013-05-03 LAB — URINE CULTURE

## 2013-05-03 LAB — EXPECTORATED SPUTUM ASSESSMENT W GRAM STAIN, RFLX TO RESP C

## 2013-05-04 LAB — URINE CULTURE

## 2013-05-06 LAB — CULTURE, BLOOD (SINGLE)

## 2013-05-20 ENCOUNTER — Ambulatory Visit: Payer: Self-pay | Admitting: Internal Medicine

## 2013-06-05 ENCOUNTER — Ambulatory Visit: Payer: Self-pay | Admitting: Urology

## 2013-06-08 ENCOUNTER — Emergency Department: Payer: Self-pay | Admitting: Emergency Medicine

## 2013-06-08 LAB — COMPREHENSIVE METABOLIC PANEL
ALBUMIN: 2.5 g/dL — AB (ref 3.4–5.0)
ALK PHOS: 154 U/L — AB
ALT: 21 U/L (ref 12–78)
Anion Gap: 5 — ABNORMAL LOW (ref 7–16)
BUN: 9 mg/dL (ref 7–18)
Bilirubin,Total: 0.4 mg/dL (ref 0.2–1.0)
CALCIUM: 9 mg/dL (ref 8.5–10.1)
Chloride: 96 mmol/L — ABNORMAL LOW (ref 98–107)
Co2: 29 mmol/L (ref 21–32)
Creatinine: 0.64 mg/dL (ref 0.60–1.30)
GLUCOSE: 82 mg/dL (ref 65–99)
Osmolality: 259 (ref 275–301)
Potassium: 4.2 mmol/L (ref 3.5–5.1)
SGOT(AST): 58 U/L — ABNORMAL HIGH (ref 15–37)
Sodium: 130 mmol/L — ABNORMAL LOW (ref 136–145)
TOTAL PROTEIN: 7.1 g/dL (ref 6.4–8.2)

## 2013-06-08 LAB — CBC WITH DIFFERENTIAL/PLATELET
Basophil #: 0 10*3/uL (ref 0.0–0.1)
Basophil %: 0.4 %
Eosinophil #: 0.3 10*3/uL (ref 0.0–0.7)
Eosinophil %: 3.8 %
HCT: 39.1 % (ref 35.0–47.0)
HGB: 12.6 g/dL (ref 12.0–16.0)
LYMPHS PCT: 17.2 %
Lymphocyte #: 1.5 10*3/uL (ref 1.0–3.6)
MCH: 28.6 pg (ref 26.0–34.0)
MCHC: 32.2 g/dL (ref 32.0–36.0)
MCV: 89 fL (ref 80–100)
Monocyte #: 0.6 x10 3/mm (ref 0.2–0.9)
Monocyte %: 6.5 %
Neutrophil #: 6.3 10*3/uL (ref 1.4–6.5)
Neutrophil %: 72.1 %
Platelet: 300 10*3/uL (ref 150–440)
RBC: 4.39 10*6/uL (ref 3.80–5.20)
RDW: 14.4 % (ref 11.5–14.5)
WBC: 8.8 10*3/uL (ref 3.6–11.0)

## 2013-06-08 LAB — URINALYSIS, COMPLETE
Bilirubin,UR: NEGATIVE
GLUCOSE, UR: NEGATIVE mg/dL (ref 0–75)
KETONE: NEGATIVE
Nitrite: NEGATIVE
Ph: 5 (ref 4.5–8.0)
Protein: NEGATIVE
SPECIFIC GRAVITY: 1.008 (ref 1.003–1.030)
WBC UR: 23 /HPF (ref 0–5)

## 2013-06-17 ENCOUNTER — Ambulatory Visit: Payer: Self-pay | Admitting: Orthopedic Surgery

## 2013-07-02 ENCOUNTER — Ambulatory Visit: Payer: Self-pay | Admitting: Orthopedic Surgery

## 2013-07-08 LAB — PATHOLOGY REPORT

## 2013-07-21 ENCOUNTER — Emergency Department: Payer: Self-pay | Admitting: Emergency Medicine

## 2013-07-21 LAB — COMPREHENSIVE METABOLIC PANEL
ALK PHOS: 151 U/L — AB
ALT: 15 U/L (ref 12–78)
ANION GAP: 6 — AB (ref 7–16)
Albumin: 2.5 g/dL — ABNORMAL LOW (ref 3.4–5.0)
BILIRUBIN TOTAL: 0.4 mg/dL (ref 0.2–1.0)
BUN: 8 mg/dL (ref 7–18)
CALCIUM: 9 mg/dL (ref 8.5–10.1)
CREATININE: 0.74 mg/dL (ref 0.60–1.30)
Chloride: 95 mmol/L — ABNORMAL LOW (ref 98–107)
Co2: 27 mmol/L (ref 21–32)
EGFR (Non-African Amer.): >60
Glucose: 99 mg/dL (ref 65–99)
OSMOLALITY: 255 (ref 275–301)
POTASSIUM: 4.1 mmol/L (ref 3.5–5.1)
SGOT(AST): 44 U/L — ABNORMAL HIGH (ref 15–37)
Sodium: 128 mmol/L — ABNORMAL LOW (ref 136–145)
TOTAL PROTEIN: 7.7 g/dL (ref 6.4–8.2)

## 2013-07-21 LAB — CBC
HCT: 36.5 % (ref 35.0–47.0)
HGB: 11.3 g/dL — ABNORMAL LOW (ref 12.0–16.0)
MCH: 28.7 pg (ref 26.0–34.0)
MCHC: 31.1 g/dL — ABNORMAL LOW (ref 32.0–36.0)
MCV: 92 fL (ref 80–100)
PLATELETS: 415 10*3/uL (ref 150–440)
RBC: 3.96 10*6/uL (ref 3.80–5.20)
RDW: 15 % — AB (ref 11.5–14.5)
WBC: 10.1 10*3/uL (ref 3.6–11.0)

## 2013-07-21 LAB — TROPONIN I

## 2013-08-19 ENCOUNTER — Emergency Department: Payer: Self-pay | Admitting: Emergency Medicine

## 2013-08-19 LAB — URINALYSIS, COMPLETE
BILIRUBIN, UR: NEGATIVE
BLOOD: NEGATIVE
Bacteria: NONE SEEN
Glucose,UR: NEGATIVE mg/dL (ref 0–75)
Ketone: NEGATIVE
NITRITE: NEGATIVE
PH: 6 (ref 4.5–8.0)
Specific Gravity: 1.011 (ref 1.003–1.030)
Squamous Epithelial: 16
WBC UR: 125 /HPF (ref 0–5)

## 2013-08-22 LAB — URINE CULTURE

## 2013-10-21 ENCOUNTER — Inpatient Hospital Stay: Payer: Self-pay | Admitting: Internal Medicine

## 2013-10-21 LAB — URINALYSIS, COMPLETE
Specific Gravity: 1.02 (ref 1.003–1.030)
Squamous Epithelial: 1
WBC UR: 2 /HPF (ref 0–5)

## 2013-10-21 LAB — CBC
HCT: 32.6 % — AB (ref 35.0–47.0)
HGB: 10.5 g/dL — ABNORMAL LOW (ref 12.0–16.0)
MCH: 30.3 pg (ref 26.0–34.0)
MCHC: 32.2 g/dL (ref 32.0–36.0)
MCV: 94 fL (ref 80–100)
Platelet: 407 10*3/uL (ref 150–440)
RBC: 3.46 10*6/uL — ABNORMAL LOW (ref 3.80–5.20)
RDW: 12.8 % (ref 11.5–14.5)
WBC: 15.6 10*3/uL — ABNORMAL HIGH (ref 3.6–11.0)

## 2013-10-21 LAB — CK TOTAL AND CKMB (NOT AT ARMC)
CK, Total: 102 U/L
CK-MB: 1.7 ng/mL (ref 0.5–3.6)

## 2013-10-21 LAB — COMPREHENSIVE METABOLIC PANEL
ALBUMIN: 2.2 g/dL — AB (ref 3.4–5.0)
ALK PHOS: 303 U/L — AB
ANION GAP: 7 (ref 7–16)
BUN: 31 mg/dL — AB (ref 7–18)
Bilirubin,Total: 0.6 mg/dL (ref 0.2–1.0)
CALCIUM: 9.2 mg/dL (ref 8.5–10.1)
CHLORIDE: 91 mmol/L — AB (ref 98–107)
CO2: 29 mmol/L (ref 21–32)
CREATININE: 1.32 mg/dL — AB (ref 0.60–1.30)
EGFR (African American): 49 — ABNORMAL LOW
GFR CALC NON AF AMER: 42 — AB
Glucose: 97 mg/dL (ref 65–99)
OSMOLALITY: 262 (ref 275–301)
Potassium: 5.4 mmol/L — ABNORMAL HIGH (ref 3.5–5.1)
SGOT(AST): 127 U/L — ABNORMAL HIGH (ref 15–37)
SGPT (ALT): 68 U/L (ref 12–78)
Sodium: 127 mmol/L — ABNORMAL LOW (ref 136–145)
TOTAL PROTEIN: 7 g/dL (ref 6.4–8.2)

## 2013-10-21 LAB — TROPONIN I: Troponin-I: <0.02 ng/mL

## 2013-10-21 LAB — DIGOXIN LEVEL: Digoxin: 1.5 ng/mL

## 2013-10-22 LAB — CBC WITH DIFFERENTIAL/PLATELET
Basophil #: 0 10*3/uL (ref 0.0–0.1)
Basophil %: 0.1 %
EOS ABS: 0 10*3/uL (ref 0.0–0.7)
EOS PCT: 0 %
HCT: 29 % — ABNORMAL LOW (ref 35.0–47.0)
HGB: 9.2 g/dL — ABNORMAL LOW (ref 12.0–16.0)
Lymphocyte #: 0.3 10*3/uL — ABNORMAL LOW (ref 1.0–3.6)
Lymphocyte %: 2.5 %
MCH: 29.7 pg (ref 26.0–34.0)
MCHC: 31.8 g/dL — ABNORMAL LOW (ref 32.0–36.0)
MCV: 94 fL (ref 80–100)
Monocyte #: 0.1 x10 3/mm — ABNORMAL LOW (ref 0.2–0.9)
Monocyte %: 1 %
NEUTROS PCT: 96.4 %
Neutrophil #: 10.2 10*3/uL — ABNORMAL HIGH (ref 1.4–6.5)
Platelet: 310 10*3/uL (ref 150–440)
RBC: 3.1 10*6/uL — AB (ref 3.80–5.20)
RDW: 13 % (ref 11.5–14.5)
WBC: 10.6 10*3/uL (ref 3.6–11.0)

## 2013-10-22 LAB — BASIC METABOLIC PANEL
Anion Gap: 5 — ABNORMAL LOW (ref 7–16)
BUN: 17 mg/dL (ref 7–18)
CREATININE: 0.6 mg/dL (ref 0.60–1.30)
Calcium, Total: 8.6 mg/dL (ref 8.5–10.1)
Chloride: 96 mmol/L — ABNORMAL LOW (ref 98–107)
Co2: 29 mmol/L (ref 21–32)
EGFR (African American): >60
EGFR (Non-African Amer.): >60
GLUCOSE: 110 mg/dL — AB (ref 65–99)
Osmolality: 263 (ref 275–301)
POTASSIUM: 4.7 mmol/L (ref 3.5–5.1)
SODIUM: 130 mmol/L — AB (ref 136–145)

## 2013-10-22 LAB — MAGNESIUM: Magnesium: 1.5 mg/dL — ABNORMAL LOW

## 2013-10-22 LAB — TSH: Thyroid Stimulating Horm: 0.093 u[IU]/mL — ABNORMAL LOW

## 2013-10-23 LAB — BASIC METABOLIC PANEL
Anion Gap: 5 — ABNORMAL LOW (ref 7–16)
BUN: 11 mg/dL (ref 7–18)
CO2: 29 mmol/L (ref 21–32)
Calcium, Total: 8.6 mg/dL (ref 8.5–10.1)
Chloride: 98 mmol/L (ref 98–107)
Creatinine: 0.45 mg/dL — ABNORMAL LOW (ref 0.60–1.30)
EGFR (African American): >60
EGFR (Non-African Amer.): >60
GLUCOSE: 132 mg/dL — AB (ref 65–99)
Osmolality: 266 (ref 275–301)
Potassium: 4.1 mmol/L (ref 3.5–5.1)
SODIUM: 132 mmol/L — AB (ref 136–145)

## 2013-10-23 LAB — CBC WITH DIFFERENTIAL/PLATELET
BASOS ABS: 0 10*3/uL (ref 0.0–0.1)
Basophil %: 0.1 %
Eosinophil #: 0 10*3/uL (ref 0.0–0.7)
Eosinophil %: 0 %
HCT: 26.6 % — AB (ref 35.0–47.0)
HGB: 8.5 g/dL — ABNORMAL LOW (ref 12.0–16.0)
Lymphocyte #: 0.4 10*3/uL — ABNORMAL LOW (ref 1.0–3.6)
Lymphocyte %: 5.2 %
MCH: 29.9 pg (ref 26.0–34.0)
MCHC: 32.2 g/dL (ref 32.0–36.0)
MCV: 93 fL (ref 80–100)
MONO ABS: 0.2 x10 3/mm (ref 0.2–0.9)
Monocyte %: 2.7 %
Neutrophil #: 6.9 10*3/uL — ABNORMAL HIGH (ref 1.4–6.5)
Neutrophil %: 92 %
Platelet: 338 10*3/uL (ref 150–440)
RBC: 2.86 10*6/uL — AB (ref 3.80–5.20)
RDW: 13.5 % (ref 11.5–14.5)
WBC: 7.5 10*3/uL (ref 3.6–11.0)

## 2013-10-23 LAB — MAGNESIUM: MAGNESIUM: 1.7 mg/dL — AB

## 2013-10-24 LAB — CBC WITH DIFFERENTIAL/PLATELET
Basophil #: 0 10*3/uL (ref 0.0–0.1)
Basophil %: 0.1 %
Eosinophil #: 0 10*3/uL (ref 0.0–0.7)
Eosinophil %: 0.1 %
HCT: 31.5 % — AB (ref 35.0–47.0)
HGB: 9.9 g/dL — ABNORMAL LOW (ref 12.0–16.0)
LYMPHS ABS: 0.4 10*3/uL — AB (ref 1.0–3.6)
Lymphocyte %: 3.9 %
MCH: 29.3 pg (ref 26.0–34.0)
MCHC: 31.3 g/dL — AB (ref 32.0–36.0)
MCV: 94 fL (ref 80–100)
Monocyte #: 0.3 x10 3/mm (ref 0.2–0.9)
Monocyte %: 2.7 %
Neutrophil #: 10.6 10*3/uL — ABNORMAL HIGH (ref 1.4–6.5)
Neutrophil %: 93.2 %
Platelet: 471 10*3/uL — ABNORMAL HIGH (ref 150–440)
RBC: 3.36 10*6/uL — ABNORMAL LOW (ref 3.80–5.20)
RDW: 13.6 % (ref 11.5–14.5)
WBC: 11.3 10*3/uL — ABNORMAL HIGH (ref 3.6–11.0)

## 2013-10-24 LAB — BASIC METABOLIC PANEL
Anion Gap: 4 — ABNORMAL LOW (ref 7–16)
BUN: 14 mg/dL (ref 7–18)
CO2: 32 mmol/L (ref 21–32)
CREATININE: 0.5 mg/dL — AB (ref 0.60–1.30)
Calcium, Total: 9 mg/dL (ref 8.5–10.1)
Chloride: 99 mmol/L (ref 98–107)
EGFR (African American): >60
EGFR (Non-African Amer.): >60
Glucose: 128 mg/dL — ABNORMAL HIGH (ref 65–99)
OSMOLALITY: 272 (ref 275–301)
Potassium: 4.6 mmol/L (ref 3.5–5.1)
Sodium: 135 mmol/L — ABNORMAL LOW (ref 136–145)

## 2013-10-24 LAB — CLOSTRIDIUM DIFFICILE(ARMC)

## 2013-10-25 LAB — EXPECTORATED SPUTUM ASSESSMENT W GRAM STAIN, RFLX TO RESP C

## 2013-10-26 LAB — CULTURE, BLOOD (SINGLE)

## 2013-11-30 ENCOUNTER — Inpatient Hospital Stay: Payer: Self-pay | Admitting: Internal Medicine

## 2013-11-30 LAB — URINALYSIS, COMPLETE
Bacteria: NONE SEEN
Bilirubin,UR: NEGATIVE
Glucose,UR: NEGATIVE mg/dL (ref 0–75)
Ketone: NEGATIVE
Nitrite: NEGATIVE
Ph: 7 (ref 4.5–8.0)
Protein: 100
SPECIFIC GRAVITY: 1.005 (ref 1.003–1.030)
WBC UR: 7 /HPF (ref 0–5)

## 2013-11-30 LAB — COMPREHENSIVE METABOLIC PANEL
ALK PHOS: 209 U/L — AB
AST: 45 U/L — AB (ref 15–37)
Albumin: 2.1 g/dL — ABNORMAL LOW (ref 3.4–5.0)
Anion Gap: 7 (ref 7–16)
BILIRUBIN TOTAL: 0.5 mg/dL (ref 0.2–1.0)
BUN: 16 mg/dL (ref 7–18)
Calcium, Total: 8.4 mg/dL — ABNORMAL LOW (ref 8.5–10.1)
Chloride: 93 mmol/L — ABNORMAL LOW (ref 98–107)
Co2: 30 mmol/L (ref 21–32)
Creatinine: 0.85 mg/dL (ref 0.60–1.30)
GLUCOSE: 92 mg/dL (ref 65–99)
OSMOLALITY: 262 (ref 275–301)
POTASSIUM: 4.4 mmol/L (ref 3.5–5.1)
SGPT (ALT): 21 U/L (ref 12–78)
Sodium: 130 mmol/L — ABNORMAL LOW (ref 136–145)
TOTAL PROTEIN: 6.6 g/dL (ref 6.4–8.2)

## 2013-11-30 LAB — CBC
HCT: 29.8 % — ABNORMAL LOW (ref 35.0–47.0)
HGB: 9.8 g/dL — ABNORMAL LOW (ref 12.0–16.0)
MCH: 32 pg (ref 26.0–34.0)
MCHC: 32.9 g/dL (ref 32.0–36.0)
MCV: 97 fL (ref 80–100)
Platelet: 362 10*3/uL (ref 150–440)
RBC: 3.06 10*6/uL — ABNORMAL LOW (ref 3.80–5.20)
RDW: 15.7 % — ABNORMAL HIGH (ref 11.5–14.5)
WBC: 14.7 10*3/uL — AB (ref 3.6–11.0)

## 2013-11-30 LAB — TROPONIN I

## 2013-12-01 LAB — CBC WITH DIFFERENTIAL/PLATELET
Basophil #: 0 10*3/uL (ref 0.0–0.1)
Basophil %: 0.1 %
Eosinophil #: 0 10*3/uL (ref 0.0–0.7)
Eosinophil %: 0.2 %
HCT: 26.1 % — ABNORMAL LOW (ref 35.0–47.0)
HGB: 8.5 g/dL — AB (ref 12.0–16.0)
LYMPHS PCT: 4 %
Lymphocyte #: 0.4 10*3/uL — ABNORMAL LOW (ref 1.0–3.6)
MCH: 31.9 pg (ref 26.0–34.0)
MCHC: 32.4 g/dL (ref 32.0–36.0)
MCV: 99 fL (ref 80–100)
MONOS PCT: 1.2 %
Monocyte #: 0.1 x10 3/mm — ABNORMAL LOW (ref 0.2–0.9)
NEUTROS PCT: 94.5 %
Neutrophil #: 8.8 10*3/uL — ABNORMAL HIGH (ref 1.4–6.5)
Platelet: 316 10*3/uL (ref 150–440)
RBC: 2.65 10*6/uL — AB (ref 3.80–5.20)
RDW: 15.4 % — ABNORMAL HIGH (ref 11.5–14.5)
WBC: 9.4 10*3/uL (ref 3.6–11.0)

## 2013-12-01 LAB — BASIC METABOLIC PANEL
Anion Gap: 4 — ABNORMAL LOW (ref 7–16)
BUN: 13 mg/dL (ref 7–18)
CREATININE: 0.77 mg/dL (ref 0.60–1.30)
Calcium, Total: 8.2 mg/dL — ABNORMAL LOW (ref 8.5–10.1)
Chloride: 98 mmol/L (ref 98–107)
Co2: 31 mmol/L (ref 21–32)
EGFR (Non-African Amer.): >60
Glucose: 115 mg/dL — ABNORMAL HIGH (ref 65–99)
Osmolality: 267 (ref 275–301)
Potassium: 4.6 mmol/L (ref 3.5–5.1)
Sodium: 133 mmol/L — ABNORMAL LOW (ref 136–145)

## 2013-12-01 LAB — MAGNESIUM: Magnesium: 1.7 mg/dL — ABNORMAL LOW

## 2013-12-01 LAB — DIGOXIN LEVEL: DIGOXIN: 0.19 ng/mL

## 2013-12-01 LAB — TROPONIN I: Troponin-I: <0.02 ng/mL

## 2013-12-02 LAB — URINE CULTURE

## 2013-12-03 LAB — VANCOMYCIN, TROUGH: Vancomycin, Trough: 12 ug/mL (ref 10–20)

## 2013-12-03 LAB — BASIC METABOLIC PANEL
Anion Gap: 3 — ABNORMAL LOW (ref 7–16)
BUN: 17 mg/dL (ref 7–18)
Calcium, Total: 8.3 mg/dL — ABNORMAL LOW (ref 8.5–10.1)
Chloride: 99 mmol/L (ref 98–107)
Co2: 32 mmol/L (ref 21–32)
Creatinine: 0.75 mg/dL (ref 0.60–1.30)
EGFR (African American): >60
Glucose: 153 mg/dL — ABNORMAL HIGH (ref 65–99)
Osmolality: 273 (ref 275–301)
Potassium: 4 mmol/L (ref 3.5–5.1)
SODIUM: 134 mmol/L — AB (ref 136–145)

## 2013-12-03 LAB — CBC WITH DIFFERENTIAL/PLATELET
Basophil #: 0 10*3/uL (ref 0.0–0.1)
Basophil %: 0 %
Eosinophil #: 0 10*3/uL (ref 0.0–0.7)
Eosinophil %: 0 %
HCT: 27.5 % — AB (ref 35.0–47.0)
HGB: 8.9 g/dL — ABNORMAL LOW (ref 12.0–16.0)
Lymphocyte #: 0.4 10*3/uL — ABNORMAL LOW (ref 1.0–3.6)
Lymphocyte %: 7.2 %
MCH: 31.8 pg (ref 26.0–34.0)
MCHC: 32.3 g/dL (ref 32.0–36.0)
MCV: 98 fL (ref 80–100)
MONO ABS: 0.1 x10 3/mm — AB (ref 0.2–0.9)
Monocyte %: 2.8 %
NEUTROS ABS: 4.6 10*3/uL (ref 1.4–6.5)
Neutrophil %: 90 %
Platelet: 377 10*3/uL (ref 150–440)
RBC: 2.79 10*6/uL — ABNORMAL LOW (ref 3.80–5.20)
RDW: 14.9 % — ABNORMAL HIGH (ref 11.5–14.5)
WBC: 5.1 10*3/uL (ref 3.6–11.0)

## 2013-12-04 LAB — CBC WITH DIFFERENTIAL/PLATELET
Basophil #: 0 10*3/uL (ref 0.0–0.1)
Basophil %: 0.1 %
Eosinophil #: 0 10*3/uL (ref 0.0–0.7)
Eosinophil %: 0 %
HCT: 26.9 % — ABNORMAL LOW (ref 35.0–47.0)
HGB: 8.5 g/dL — ABNORMAL LOW (ref 12.0–16.0)
LYMPHS PCT: 6.3 %
Lymphocyte #: 0.3 10*3/uL — ABNORMAL LOW (ref 1.0–3.6)
MCH: 31.1 pg (ref 26.0–34.0)
MCHC: 31.5 g/dL — ABNORMAL LOW (ref 32.0–36.0)
MCV: 99 fL (ref 80–100)
MONO ABS: 0.2 x10 3/mm (ref 0.2–0.9)
Monocyte %: 4.9 %
NEUTROS ABS: 4.3 10*3/uL (ref 1.4–6.5)
Neutrophil %: 88.7 %
Platelet: 391 10*3/uL (ref 150–440)
RBC: 2.73 10*6/uL — AB (ref 3.80–5.20)
RDW: 14.7 % — ABNORMAL HIGH (ref 11.5–14.5)
WBC: 4.9 10*3/uL (ref 3.6–11.0)

## 2013-12-04 LAB — BASIC METABOLIC PANEL
ANION GAP: 4 — AB (ref 7–16)
BUN: 18 mg/dL (ref 7–18)
CALCIUM: 8 mg/dL — AB (ref 8.5–10.1)
CHLORIDE: 99 mmol/L (ref 98–107)
CO2: 31 mmol/L (ref 21–32)
Creatinine: 0.76 mg/dL (ref 0.60–1.30)
EGFR (African American): >60
Glucose: 134 mg/dL — ABNORMAL HIGH (ref 65–99)
Osmolality: 272 (ref 275–301)
Potassium: 4.2 mmol/L (ref 3.5–5.1)
SODIUM: 134 mmol/L — AB (ref 136–145)

## 2013-12-04 LAB — EXPECTORATED SPUTUM ASSESSMENT W GRAM STAIN, RFLX TO RESP C

## 2013-12-05 LAB — CREATININE, SERUM
CREATININE: 0.87 mg/dL (ref 0.60–1.30)
EGFR (Non-African Amer.): >60

## 2013-12-05 LAB — VANCOMYCIN, TROUGH
VANCOMYCIN, TROUGH: 18 ug/mL (ref 10–20)
Vancomycin, Trough: 23 ug/mL (ref 10–20)

## 2013-12-05 LAB — CULTURE, BLOOD (SINGLE)

## 2013-12-07 LAB — CREATININE, SERUM
CREATININE: 0.66 mg/dL (ref 0.60–1.30)
EGFR (African American): >60
EGFR (Non-African Amer.): >60

## 2014-02-25 ENCOUNTER — Inpatient Hospital Stay: Payer: Self-pay | Admitting: Internal Medicine

## 2014-02-25 DIAGNOSIS — R0602 Shortness of breath: Secondary | ICD-10-CM

## 2014-02-25 DIAGNOSIS — I4891 Unspecified atrial fibrillation: Secondary | ICD-10-CM

## 2014-02-25 LAB — COMPREHENSIVE METABOLIC PANEL
ALT: 20 U/L
AST: 28 U/L (ref 15–37)
Albumin: 2.5 g/dL — ABNORMAL LOW (ref 3.4–5.0)
Alkaline Phosphatase: 207 U/L — ABNORMAL HIGH
Anion Gap: 4 — ABNORMAL LOW (ref 7–16)
BUN: 8 mg/dL (ref 7–18)
Bilirubin,Total: 0.4 mg/dL (ref 0.2–1.0)
CALCIUM: 8.8 mg/dL (ref 8.5–10.1)
CREATININE: 0.52 mg/dL — AB (ref 0.60–1.30)
Chloride: 94 mmol/L — ABNORMAL LOW (ref 98–107)
Co2: 37 mmol/L — ABNORMAL HIGH (ref 21–32)
EGFR (African American): >60
EGFR (Non-African Amer.): >60
Glucose: 93 mg/dL (ref 65–99)
Osmolality: 268 (ref 275–301)
POTASSIUM: 4.1 mmol/L (ref 3.5–5.1)
Sodium: 135 mmol/L — ABNORMAL LOW (ref 136–145)
Total Protein: 6.8 g/dL (ref 6.4–8.2)

## 2014-02-25 LAB — CBC WITH DIFFERENTIAL/PLATELET
Basophil #: 0 10*3/uL (ref 0.0–0.1)
Basophil %: 0.2 %
EOS ABS: 0.1 10*3/uL (ref 0.0–0.7)
EOS PCT: 2.1 %
HCT: 33 % — AB (ref 35.0–47.0)
HGB: 10.1 g/dL — AB (ref 12.0–16.0)
Lymphocyte #: 1 10*3/uL (ref 1.0–3.6)
Lymphocyte %: 16.3 %
MCH: 27.2 pg (ref 26.0–34.0)
MCHC: 30.5 g/dL — AB (ref 32.0–36.0)
MCV: 89 fL (ref 80–100)
MONOS PCT: 11.4 %
Monocyte #: 0.7 x10 3/mm (ref 0.2–0.9)
NEUTROS ABS: 4.5 10*3/uL (ref 1.4–6.5)
Neutrophil %: 70 %
Platelet: 302 10*3/uL (ref 150–440)
RBC: 3.7 10*6/uL — ABNORMAL LOW (ref 3.80–5.20)
RDW: 15.6 % — ABNORMAL HIGH (ref 11.5–14.5)
WBC: 6.4 10*3/uL (ref 3.6–11.0)

## 2014-02-25 LAB — TSH: Thyroid Stimulating Horm: 0.854 u[IU]/mL

## 2014-02-25 LAB — DIGOXIN LEVEL: Digoxin: <0.1 ng/mL — ABNORMAL LOW

## 2014-02-26 LAB — CBC WITH DIFFERENTIAL/PLATELET
Basophil #: 0 10*3/uL (ref 0.0–0.1)
Basophil %: 0.1 %
Eosinophil #: 0 10*3/uL (ref 0.0–0.7)
Eosinophil %: 0 %
HCT: 32.1 % — AB (ref 35.0–47.0)
HGB: 10.5 g/dL — AB (ref 12.0–16.0)
LYMPHS ABS: 0.6 10*3/uL — AB (ref 1.0–3.6)
Lymphocyte %: 19.8 %
MCH: 28.5 pg (ref 26.0–34.0)
MCHC: 32.6 g/dL (ref 32.0–36.0)
MCV: 87 fL (ref 80–100)
MONOS PCT: 0.9 %
Monocyte #: 0 x10 3/mm — ABNORMAL LOW (ref 0.2–0.9)
NEUTROS ABS: 2.2 10*3/uL (ref 1.4–6.5)
NEUTROS PCT: 79.2 %
Platelet: 294 10*3/uL (ref 150–440)
RBC: 3.67 10*6/uL — ABNORMAL LOW (ref 3.80–5.20)
RDW: 15.9 % — ABNORMAL HIGH (ref 11.5–14.5)
WBC: 2.8 10*3/uL — ABNORMAL LOW (ref 3.6–11.0)

## 2014-02-26 LAB — COMPREHENSIVE METABOLIC PANEL
ALBUMIN: 2.2 g/dL — AB (ref 3.4–5.0)
ALK PHOS: 195 U/L — AB
AST: 23 U/L (ref 15–37)
Anion Gap: 5 — ABNORMAL LOW (ref 7–16)
BILIRUBIN TOTAL: 0.4 mg/dL (ref 0.2–1.0)
BUN: 11 mg/dL (ref 7–18)
Calcium, Total: 8.7 mg/dL (ref 8.5–10.1)
Chloride: 92 mmol/L — ABNORMAL LOW (ref 98–107)
Co2: 36 mmol/L — ABNORMAL HIGH (ref 21–32)
Creatinine: 0.59 mg/dL — ABNORMAL LOW (ref 0.60–1.30)
EGFR (African American): >60
EGFR (Non-African Amer.): >60
GLUCOSE: 190 mg/dL — AB (ref 65–99)
OSMOLALITY: 271 (ref 275–301)
Potassium: 4 mmol/L (ref 3.5–5.1)
SGPT (ALT): 20 U/L
SODIUM: 133 mmol/L — AB (ref 136–145)
Total Protein: 6.6 g/dL (ref 6.4–8.2)

## 2014-02-27 LAB — CBC WITH DIFFERENTIAL/PLATELET
BASOS ABS: 0 10*3/uL (ref 0.0–0.1)
Basophil %: 0.1 %
Eosinophil #: 0 10*3/uL (ref 0.0–0.7)
Eosinophil %: 0.1 %
HCT: 32.6 % — ABNORMAL LOW (ref 35.0–47.0)
HGB: 10.7 g/dL — ABNORMAL LOW (ref 12.0–16.0)
LYMPHS PCT: 7.6 %
Lymphocyte #: 0.6 10*3/uL — ABNORMAL LOW (ref 1.0–3.6)
MCH: 28.5 pg (ref 26.0–34.0)
MCHC: 32.8 g/dL (ref 32.0–36.0)
MCV: 87 fL (ref 80–100)
Monocyte #: 0.2 x10 3/mm (ref 0.2–0.9)
Monocyte %: 2.6 %
NEUTROS PCT: 89.6 %
Neutrophil #: 7.6 10*3/uL — ABNORMAL HIGH (ref 1.4–6.5)
PLATELETS: 339 10*3/uL (ref 150–440)
RBC: 3.76 10*6/uL — ABNORMAL LOW (ref 3.80–5.20)
RDW: 15.7 % — AB (ref 11.5–14.5)
WBC: 8.5 10*3/uL (ref 3.6–11.0)

## 2014-02-27 LAB — BASIC METABOLIC PANEL
Anion Gap: 5 — ABNORMAL LOW (ref 7–16)
BUN: 17 mg/dL (ref 7–18)
CREATININE: 0.54 mg/dL — AB (ref 0.60–1.30)
Calcium, Total: 8.7 mg/dL (ref 8.5–10.1)
Chloride: 93 mmol/L — ABNORMAL LOW (ref 98–107)
Co2: 35 mmol/L — ABNORMAL HIGH (ref 21–32)
EGFR (African American): >60
GLUCOSE: 102 mg/dL — AB (ref 65–99)
Osmolality: 268 (ref 275–301)
Potassium: 4.3 mmol/L (ref 3.5–5.1)
Sodium: 133 mmol/L — ABNORMAL LOW (ref 136–145)

## 2014-03-07 LAB — EXPECTORATED SPUTUM ASSESSMENT W GRAM STAIN, RFLX TO RESP C

## 2014-03-21 DEATH — deceased

## 2014-09-10 NOTE — H&P (Signed)
PATIENT NAME:  Yolanda Price, AOUN MR#:  [id number] DATE OF BIRTH:  [birth year]  DATE OF ADMISSION:  12/25/2012  PRIMARY CARE PHYSICIAN: Dr. Freda Munro  PRIMARY CARDIOLOGIST: Dr. Darrold Junker ER REFERRING PHYSICIAN: Dr. Caryn Bee    CHIEF COMPLAINT: Chest pain and cough.  HISTORY OF PRESENT ILLNESS: The patient is a 69 year old female with past history of COPD and using 2 liters oxygen at home due to chronic respiratory failure, history of DVT, congestive heart failure status post AICD, hyperlipidemia, hypothyroidism and atrial fibrillation, and as per her she had multiple admissions in the last 7 or 8 months to hospital for COPD or pneumonia. The last one for this was in June 2014. Since last week Thursday, that was 7 days ago, she started not feeling well, and she had chest pain which was all over, getting worse with cough, and she was not able to cough enough or get the sputum out because of back pain which was even getting worse with deep breaths; and, so, they called Dr. Sampson Goon office and she was seen by his PA as the doctor is on vacation. He ordered a CAT scan for her which was to be done on Tuesday, but because she got worse over the weekend they called [facility] and got it on Monday and then called the office to be seen as soon as possible, and so Tuesday she saw Dr. Milta Deiters PA again in the clinic. After reviewing the x-ray and her worsening symptoms, they started her on oral steroid prednisone tapering dose and Levaquin 750 mg b.i.d. oral. She received that for 2 days, but she did not get any better and complained got worse and worse, and she had to up her oxygen use at home also from 2 to 3 liters; so, finally, they called the doctor's office today, and they sent her to get admitted to the hospital directly from home. She had some fever last week but not in the last few days.   REVIEW OF SYSTEMS:   CONSTITUTIONAL: Positive for fever and generalized weakness. Pain in the chest. No weight  loss or weight gain.  EYES: No blurring or double vision or redness.  EARS, NOSE, THROAT: No tinnitus, ear pain or hearing loss.  RESPIRATORY: She has cough but no wheezing since she is not able to get out any sputum, so she is not aware about hemoptysis. Respiration is painful.  CARDIOVASCULAR: Chest pain due to bleeding issues, no orthopnea. Has increased swelling in the legs since the last 8 to 10 days and denies any palpitation or dizziness.  GASTROINTESTINAL: No nausea, vomiting, diarrhea or abdominal pain. She is usually constipated, which is chronic for her.  GENITOURINARY: Denies any dysuria, hematuria or increased frequency of the urination.  ENDOCRINE: Denies any nocturia, polyhydria or heat or cold intolerance.  SKIN: Denies any acne, rashes.  MUSCULOSKELETAL: No swelling or pain in the joints.  NEUROLOGICAL: No numbness, weakness, dysarthria or tremors.  PSYCHIATRIC: No anxiety, insomnia or any bipolar disorder.   PAST MEDICAL HISTORY:  1.  COPD and chronic respiratory failure, using 2 liters oxygen at home.  2.  History of DVT.  3.  Congestive heart failure.  4.  History of stroke 1 month ago.  5.  Hyperlipidemia.  6.  Hypothyroid.  7.  Chronic atrial fibrillation.  8.  Status post AICD.  9.  Depression.  10.  Hyperlipidemia.   PAST SURGICAL HISTORY:  1.  Lesion on epiglottis taken off recently.  2.  Defibrillator.  3.  Appendectomy.  4.  Partial hysterectomy.  5.  Cholecystectomy.  6.  Benign breast tumor.   SOCIAL HISTORY: She quit smoking in March 2014. Denies drinking alcohol or illegal drug use.  Lives with her daughter.   FAMILY HISTORY: Father died in 65s because of MI.  Mother died in 18s for MI. Sister died of bone cancer and another sister had breast cancer.   HOME MEDICATIONS:  Final medication review is not done yet by pharmacist, but as per the records and confirmed with the patient: 1.  Synthroid 88 mcg, take 1/2 tablet once a day.  2.  Symbicort 2  puffs 2 times a day.  3.  Spiriva 1 tablet inhalation once a day.  4.  Prednisone tablets. She was currently taking 60 mg once a day for the last 2 days.  5.  Levofloxacin 750 mg oral 2 times a day.  6.  Plavix 75 mg once a day.  7.  Pantoprazole 40 mg once a day.  8.  Lisinopril 2.5 mg once a day.  9.  Lexapro 20 mg once a day.  10.  Furosemide 20 mg, take 1/2 tablet once a day as needed for leg swelling and weight gain. For the last few days, she is taking once a day every day.  11.  Digoxin 125 mg once a day.  12.  Crestor 10 mg once a day.  13.  Clonazepam 1 mg 2 times a day.  14.  Carvedilol 6.25 mg oral 2 times a day.  15.  Aspirin 81 mg 2 times a day.  16.  Amitriptyline 100 mg once a day.  17.  Abilify 10 mg once a day.   PHYSICAL EXAMINATION: VITAL SIGNS: In ER, temperature 97.9, pulse 104, respirations 22, blood pressure 120/62 and pulse oximetry 89% on 2 liters and 93% on 3 liters oxygen supplementation, and noted to be having atrial fibrillation with paced rhythm on monitor.  GENERAL: General appearance the patient is well-developed, well-nourished and alert and oriented to time, place and person, in mild distress due to chest pain.  HEENT: Head: Atraumatic. Conjunctiva pink. Oral mucosa moist.  NECK: Supple. No JVD.  RESPIRATORY: Good respiratory effort but not able to take very deep breath due to chest pain. There is wheezing present on the chest. Decreased air entry both sides due to severe emphysema. Using oxygen supplementation 3 liters nasal cannula.  CARDIOVASCULAR: S1, S2 present, regular, tachycardia.  ABDOMEN: Soft and nontender.  Bowel sounds present. No organomegaly.  SKIN: No rashes. Legs: Mild edema present around the ankle. Joints: No swelling or tenderness.  NEUROLOGICAL: Power 5 out of 5. Moves all 4 limbs.   IMPORTANT LABORATORY AND DIAGNOSTIC RESULTS: Glucose 148, BNP 34, BUN 19, creatinine 0.83, sodium 132, potassium 4.3, chloride 95, calcium 9.3, total  protein 7.4, albumin 3.1, bilirubin total 0.2, alkaline phosphatase 135, SGOT 46 and SGPT 52. Troponin less than 0.02. WBC 15.9, hemoglobin 12.2, platelet count 438.  CT of the chest which was done as outpatient on 08/04:  Extensive emphysematous changes in both lungs.  Area of confluent increased density posteriorly, predominantly in the left lower lobe.  May be infectious or neoplastic. No pathologic-size lymph node. No evidence of CHF.  CT of the chest for pulmonary embolism done today: No evidence of pulmonary embolism, worsening lung disease with some consolidation lower lobe, peribronchial thickening suggestive of bronchitis.  EKG: A-fib with paced rhythm.   ASSESSMENT AND PLAN: A 69 year old female with extensive emphysema  and recurrent admissions in the hospital for the pneumonia and chronic obstructive pulmonary disease, came to the hospital because of no change with oral antibiotic and steroid therapy started by primary pulmonologist.  Found having worsening of her lung findings and bronchitis on CT of the chest today compared to 4 days ago.   1.  Pneumonia: We will give her broad-spectrum antibiotic for her bad lung tissues and recurrent admissions and recent antibiotic course without any help as outpatient.  Spoke to Dr. Belia HemanKasa about following her in the hospital as Dr. Welton FlakesKhan is on vacation. We will give her vancomycin, azithromycin and Zosyn for extended coverage, and we will do sputum culture.  2.  COPD exacerbation: We will give her IV steroid, nebulizer treatment, Spiriva and oxygen supplementation and will wait for pulmonary followup.  3.  Chronic CHF, status post AICD and chronic atrial fibrillation: Currently, this issue is stable. Currently, there are no exacerbation signs, so we will continue with home medications as she is taking at home including aspirin, carvedilol, clopidogrel, digoxin, furosemide, lisinopril.  4.  History of stroke: We will continue aspirin and Plavix. She totally  improved after her stroke-like presentation which was July 2014. There is no leftover weakness or numbness.  5.  Questionable left lower lobe lung mass: The patient and family are aware about this, and they are following with Dr. Welton FlakesKhan regularly by doing CAT scan for this, and it is already taken care of by primary pulmonologist.   CODE STATUS: DNR. This is the patient's wish, and her healthcare power of attorney, who are both her daughters, they are aware about this. Discussed about the patient's condition and plan in detail with them for about 55 minutes, also called Dr. Belia HemanKasa and informed him about this consult.      TOTAL TIME SPENT IN THIS ADMISSION: 55 minutes.    ____________________________ Hope PigeonVaibhavkumar G. Elisabeth PigeonVachhani, MD vgv:cb D: 12/25/2012 15:43:02 ET T: 12/25/2012 17:28:21 ET JOB#: [id number]  cc: Hope PigeonVaibhavkumar G. Elisabeth PigeonVachhani, MD, <Dictator> Yevonne PaxSaadat A. Khan, MD Altamese DillingVAIBHAVKUMAR Senon Nixon MD ELECTRONICALLY SIGNED 01/20/2013 0:40

## 2014-09-10 NOTE — Consult Note (Signed)
Brief Consult Note: Diagnosis: Pneumonia, persistent.  Anemia. Heme positive stool.  Hematemesis. Dysphagia.  COPD O2 dependent.  Hyponatremia.  Hypokalemia.   Consult note dictated.   Discussed with Attending MD.   Comments: Patient's presentation discussed with Dr. Lutricia FeilPaul Oh.  Recommendation to proceed with EGD for the indication of heme positive stool, IDA and hematemesis.  Given pulmonary status, date of procedure to be performed to be decided once she is clinically feasible.  Continue to monitor.  Transfuse as necessary with blood and/or iron.  Will monitor laboratory studies.  Continue PPI therapy.  Electronic Signatures: Rodman KeyHarrison, Dawn S (NP)  (Signed 24-Sep-14 14:52)  Authored: Brief Consult Note   Last Updated: 24-Sep-14 14:52 by Rodman KeyHarrison, Dawn S (NP)

## 2014-09-10 NOTE — H&P (Signed)
PATIENT NAME:  Yolanda Price, Yolanda Price MR#:  [id number] DATE OF BIRTH:  1946/05/14  DATE OF ADMISSION:  10/12/2012  REFERRING PHYSICIAN: Dr. Michel Santee.  PCP: Dr. Ancil Boozer.   PRIMARY CARDIOLOGIST: Dr. Saralyn Pilar.   PRIMARY PULMONOLOGIST: Dr. Bea Laura.   PRIMARY ENT: Dr. Carlis Abbott.   CHIEF COMPLAINT: Hemoptysis, and left-sided chest pain.   HISTORY OF PRESENT ILLNESS: The patient is a pleasant, obese, Caucasian female with a history of chronic obstructive pulmonary disease, with oxygen dependency at bedtime, and a dilated cardiomyopathy and CHF, status post AICD who states she had a fall over a toy a couple of days ago and fell on her left lateral side. Also  she did not have immediate pain. The pain started to come the following day, which was yesterday. Has been off-and-on, worse with coughing. It is tender to touch. She has had no fevers. She has a chronic cough and shortness of breath and wheezing, but feels this is  more pronounced recently. This morning, she had a bout of hemoptysis which she describes about a half a cup.   Of note, the patient states that she first had hemoptysis around Christmas time, and had 3 other bouts of hemoptysis. She has had a bronchoscopy with Dr. Humphrey Rolls, her pulmonologist, who referred her to ENT,  who did some procedure that she does not know per Dr. Carlis Abbott from ENT. Per daughter, and patient, who are here, Dr. Carlis Abbott had stated that if the hemoptysis does not improve they might need "surgery." They could not elaborate more at this point. Hospitalist services were contacted for further evaluation and management. Hemoptysis appears to have resolved. She has some a residual dry blood in her mouth.   PAST MEDICAL HISTORY:  1.  Dilated cardiomyopathy, nonischemic, last EF of about 45% per chart.  2.  COPD, with 2 liters of oxygen at bedtime.  3.  Status post AICD and permanent pacemaker placement.  4.  Depression.  5.  Anxiety.  6.  Thyroid goiter.  7. Hypothyroid state.   PAST  SURGICAL HISTORY:  AICD placement, appendectomy, cholecystectomy, resection of DVT behind her left knee, hysterectomy, right parotid tumor, history of melanomas on the back and arms which were excised, benign right breast lumpectomy twice.   ALLERGIES: She denies allergies, but on the chart CODEINE is noted.   SOCIAL HISTORY: Lives with her daughter. Used to smoke about 3 months ago, quit, but now she is using E-cigarettes. No alcohol.   FAMILY HISTORY: Both parents with CAD. A sister who passed breast cancer. A first cousin also had breast cancer. Mother's sister had bone cancer.   REVIEW OF SYSTEMS:  CONSTITUTIONAL: No fever. Positive for hemoptysis. Has some global weakness.  EYES: No blurry vision or double vision.  ENT: No tinnitus, hearing pain or epistaxis.  RESPIRATORY: Positive for chronic cough that is worse now. Has COPD. Has had hemoptysis, as above. Positive for wheezing and shortness of breath, more so recently.  CARDIOVASCULAR: Denies chest pain, orthopnea or swelling in the legs.  GASTROINTESTINAL: No nausea, vomiting, diarrhea, abdominal pain, melena or dark stools. GENITOURINARY: Has incontinence.  ENDOCRINE: No polyuria or nocturia.  HEMATOLOGIC/LYMPHATIC: Easy bruising.  SKIN: Has a history of melanoma.  NEUROLOGIC: Denies focal weakness or numbness.  PSYCHIATRIC: Has anxiety and depression.  NEUROLOGIC: Denies focal weakness or numbness.   PHYSICAL EXAM: VITAL SIGNS: Temperature on arrival 98.4, pulse rate 104, respiratory rate 24, blood pressure 128/73, O2 sat 92% on room air.  GENERAL: The patient is an  elderly, obese, Caucasian female laying in bed.   HEENT: Normocephalic, atraumatic. Pupils are equal and reactive. Anicteric sclerae. Extraocular muscles intact. Dry mucous membranes.  NECK: Supple. No thyroid tenderness. No cervical lymphadenopathy.  MOUTH: Examination of the mouth: I do not see any active bleeding or clots.  CARDIOVASCULAR: S1, S2. Regular rate  and rhythm. No murmurs, rubs, or gallops.  LUNGS: There is bilateral moderate wheezing, with decreased air entry. No crackles. No rhonchi.  ABDOMEN: Soft, nontender, nondistended. Positive bowel sounds in all quadrants on examination of the left lateral chest. There is no ecchymosis or bruising. The whole area appears to be tender to palpation, on deep palpation especially.  EXTREMITIES: No significant lower extremity edema.  NEUROLOGIC: Cranial nerves II-XII grossly intact. Strength is 5/5 in all extremities. Sensation is intact to light touch.  PSYCHIATRIC: Awake, alert, oriented x 3. Cooperative, pleasant.  SKIN: No obvious rashes. There is some chronic discoloration of the extremities.   LABS AND IMAGING: X-ray of the chest: Mild basilar opacity, likely secondary to atelectasis   CT of the chest, PE protocol with contrast showing no pulmonary embolus. Diffuse mild thickening of the esophageal wall. A few small scattered foci of ground-glass opacities in the right upper lobe; these are nonspecific. Small hyper-enhancing nodule in the spleen, similar to prior. Sclerosis of the vertebral body in the upper thoracic spine is nonspecific, but new from 2010.   Glucose 101, BUN 29, creatinine 0.68, sodium 127, chloride 97.   The patient does have elevated transaminitis and alk phos which is 243, AST is 324, ALT is 303. Troponin negative. WBC 9.7, hemoglobin 12.4, platelets 256.   INR is 1.  EKG is paced, rate is 95.   ASSESSMENT AND PLAN: We have a 69 year old obese Caucasian female with a history of COPD, on oxygen at night, with nonischemic dilated cardiomyopathy status post automatic implantable cardiac defibrillator placement, status post fall and sustaining some musculoskeletal pain on the left lateral side of her fall, who presents with a bout of hemoptysis which appears to be ongoing, off-and-on since Christmas. The patient also appears to have mild elevation of LFTs which is worse than her  previous numbers, and mild hyponatremia and chronic obstructive pulmonary disease exacerbation, acute-on-chronic.   Will admit the patient to the hospital. In regards to the fall, it was mechanical and the patient does have some pain and tenderness to palpation on the left lateral side, without any evidence for a PE on the CAT scan. I would start her on some Tylenol and treat the chronic obstructive pulmonary disease flare with steroids, nebs, inhalers and azithromycin. She appears to be wheezy, has a cough which exacerbates her pain. There is no note of rib fracture on the x-rays, nor on the CAT scan. The hemoptysis has been worked up by ENT and Dr. Humphrey Rolls with a bronchoscopy and other study modalities, and per family Dr. Carlis Abbott had stated if this recurs or does not improve she might need "surgery." We have consulted ENT. The hemoptysis appears to have stopped. We would check a hemoglobin q.12 hours and not start any NSAIDs or heparin for deep vein thrombosis prophylaxis. I would hold the Lasix given hyponatremia and the patient not being in acute congestive heart failure. I think the congestive heart failure is chronic and stable at this point. I would hold a statin, recheck the LFTs in the morning, and if persistently elevated consider further workup. I have continued her depression medications. Of note, there is a sclerotic  vertebral body which is new in the upper thoracic spine, but is nonspecific. This could further be followed as the patient does have a history of melanoma.   The patient is DNR.   Total time spent is 60 minutes.    ____________________________ Vivien Presto, MD sa:dm D: 10/12/2012 14:11:40 ET T: 10/12/2012 14:52:23 ET JOB#: [id number]  cc: Vivien Presto, MD, <Dictator> Allyne Gee, MD Bethena Roys. Ancil Boozer, MD Vivien Presto MD ELECTRONICALLY SIGNED 10/21/2012 20:06

## 2014-09-10 NOTE — Discharge Summary (Signed)
PATIENT NAME:  Yolanda Price, Yolanda Price MR#:  [id number] DATE OF BIRTH:  [birth year]  DATE OF ADMISSION:  04/06/2013 DATE OF DISCHARGE:    Discharge pending bed availability at [facility].   PRIMARY CARE PHYSICIAN: Dr. Carlynn Purl; however, she is under the care of Dr. Elease Hashimoto at [facility] at present.    PULMONOLOGY CONSULTATIONS: Dr. Freda Munro who is the patient's primary pulmonologist.   CHIEF COMPLAINT: Hallucinations and altered mental status, along with hyponatremia and.   DISCHARGE DIAGNOSES: 1. Acute encephalopathy, suspected metabolic in the setting of Lexapro being changed. Which coincides with the change in her dosage, now improved. 2. Left lower lobe nodule 12 mm is concerning for bronchogenic carcinoma seen by Dr. Welton Flakes. Work-up as outpatient.  3. Chronic chronic obstructive pulmonary disease/chronic respiratory failure, on 3 liters nasal cannula oxygen.  4. 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  5. Serum sodium improved to 133 at discharge.  6. Urinary tract infection, suspected colonization. Urine culture has only three colonies growing. No antibiotics needed. The patient asymptomatic.  7. History of Proteus mirabilis/MRSA pneumonia recently completed a course of Zyvox.  8. History of melanoma.  9. History of pacemaker.  10. Hypertension.  11. History of deep vein thrombosis in the past.   CODE STATUS: NO CODE, DO NOT RESUSCITATE.   DIET: Regular diet.  Physical therapy.  Continue oxygen 3 liters per minute nasal cannula.   FOLLOWUP: With Dr. Freda Munro in 1 to 2 weeks.   DuoNeb as needed q.4 p.r.n.   LABORATORY AT DISCHARGE: Sodium is 133.   Protein urine is 6, random urine sodium is 15. Glucose is 92, BUN is 10, creatinine is 1.01, potassium is 3.6 chloride is 90.   Urinalysis positive for WBCs, and RBCs. Urine culture three colonies of gram-positive rods. This is preliminary report  given by lab.  H and H is 13.9 and 42.6. White count is 11.5.   EKG: Electronic ventricular pacemaker.   CT angiography of the chest shows negative for PE. A 12 mm spiculated left lower lobe nodule progressive since previous study suggesting developing bronchogenic carcinoma. Followup recommended. Atherosclerosis including thoracic aortic and coronary artery disease. Please note, the presence of coronary artery calcium documents the presence of coronary artery disease. Severity of this disease and potential stenosis cannot be assessed on non-gaited CT examination.   MEDICATIONS AT DISCHARGE: 1. Tylenol 650 q.4 p.r.n.  2. colace 100 mg b.i.d. p.r.n.  3. Protonix 40 mg daily in the evening at 4:30.  4. Aspirin 81 mg daily.  5. Percocet 7.5/325, 1 tablet q.6 p.r.n.  6. Digoxin 0.125 p.o. daily.  7. Klonopin 2 mg at bedtime p.r.n.  8. Atorvastatin 40 mg daily.  9. Advair 350/50, 1 puff b.i.d.  10. Spiriva 1 capsule inhalation daily.  11. Ferrous sulfate 325  p.o. b.i.d.  12. Demadex 40 mg p.o. daily.  13. Guaifenesin 600 mg b.i.d.  14. Mag-Ox 400 mg p.o. b.i.d.  15. Potassium chloride 20 mEq  b.i.d.  16. Omega-3 fatty acid 1 gram orally daily.  17. Wellbutrin XL 300 mg p.o. daily.  18. Risaquad lactobacillus probiotic capsule, 1 p.o. b.i.d.  19. Synthroid 0.088 mg p.o. daily.  20. Lexapro 20 mg daily.  21. Abilify 10 mg at bedtime.  22. Amitriptyline 100 mg at bedtime.   BRIEF SUMMARY OF HOSPITAL COURSE: Yolanda Price is a 69 year old Caucasian female well-known to our service from previous many admissions, comes to the Emergency  Room [facility] after she was found to have hallucinations and confusion. She was found to have:  1. Hallucinations/acute encephalopathy, which was secondary to her Lexapro being changed as her symptoms seemed to coincide with the change in the dosage. Her dosage was changed because she was on Zyvox for her MRSA pneumonia recently discharged from the  hospital and due to drug interaction dosage was changed. The patient finished up the Zyvox. Her Lexapro is changed back to 20 mg. Her symptoms seem to be resolving and doing well.  2. Abnormal urinalysis. The patient does not have symptoms of urinary tract infection. She was empirically started on meropenem, which has been discontinued. She is not having fever, likely colonization. Her culture is growing three colonies of gram-positive rods preliminary cultures from the lab. I will discontinue antibiotics.  3. Left lower lobe lung nodule 12 mm concerning for bronchogenic carcinoma. The patient was seen by Dr. Welton FlakesKhan, recommends outpatient follow-up.  4. 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 She appears euvolemic. She did receive a liter of IV fluids. Sodium is up to 133.  5. Chronic obstructive pulmonary disease with chronic respiratory failure, seems to be stable. Continue nebulizing inhalers and her oxygen 2 to 3 liters.  6. Deep vein thrombosis prophylaxis with heparin.  7. Hypertension.  8. Anxiety, depression. Clonazepam was continued.  9. Hyperlipidemia. The patient is on atorvastatin and omega-3 fatty acid.   Overall hospital stay otherwise remained stable. We will discharge the patient once a bed is available at [facility].   TIME SPENT: 40 minutes.  ____________________________ Wylie HailSona A. Allena KatzPatel, MD sap:sg D: 04/08/2013 10:18:00 ET T: 04/08/2013 10:53:01 ET JOB#: [id number]  cc: Serafina Topham A. Allena KatzPatel, MD, <Dictator> Onnie BoerKrichna F. Carlynn PurlSowles, MD Leo GrosserNancy J. Maloney, MD Yevonne PaxSaadat A. Khan, MD  Willow OraSONA A Dillen Belmontes MD ELECTRONICALLY SIGNED 04/13/2013 14:15

## 2014-09-10 NOTE — Consult Note (Signed)
PATIENT NAME:  Yolanda Price, DULA MR#:  [id number] DATE OF BIRTH:  1945-09-13  DATE OF CONSULTATION:  10/12/2012  REFERRING PHYSICIAN:  Dr. Jacques Navy.  CONSULTING PHYSICIAN:  Ollen Gross. Willeen Cass, MD  REASON FOR CONSULTATION: Hemoptysis.   HISTORY OF PRESENTING ILLNESS: This is a 69 year old female who has had intermittent recurrent hemoptysis since December or so, previously evaluated by both Dr. Welton Flakes and by Dr. Gertie Baron. Apparently, Dr. Welton Flakes did not find a definitive source for her bleeding, although reportedly, there was a biopsy was taken of an epiglottic lesion which was negative for malignancy. Dr. Chestine Spore saw the patient in the office and on fiberoptic endoscopy noted a small lesion on the tip of the epiglottis with a vascular appearance to it. He postulated it could be the source of the bleeding. She is now in the hospital with left-sided chest pain. She has a history of COPD and is oxygen dependent, as well as dilated cardiomyopathy status post AICD. She says she fell over a toy a couple of days ago, falling towards her left lateral side but did not have any pain immediately. Yesterday, she began having left-sided chest pain which has been worse off and on with coughing. This morning, she had a bout of hemoptysis which consisted of about a half a cup of bright red blood that resolved spontaneously. She has had 3 other bouts of hemoptysis since last December. Dr. Chestine Spore had indicated he might perform surgery on the epiglottic lesion if her hemoptysis persisted but wanted to see her back in followup and monitor the lesion. When I came into the room, she had an episode of coughing and coughed up grossly purulent sputum with some slight brownish tinge like old blood.   PAST MEDICAL HISTORY: Dilated cardiomyopathy with an EF of 45%, COPD with 2 liters of oxygen at home, status post AICD and permanent pacemaker placement, depression, anxiety, thyroid goiter, hypothyroidism.   PAST SURGICAL HISTORY: AICD  placement, appendectomy, cholecystectomy, resection of DVT behind her left knee, hysterectomy, right parotid tumor, history of melanomas on the back and arms were excised, benign right breast lumpectomy twice.   ALLERGIES: CODEINE.   SOCIAL HISTORY: The patient lives with her daughter and used to smoke until about 3 months ago. She is currently using E-cigarettes. She denies alcohol use.   FAMILY HISTORY: Both parents with coronary artery disease. Sister died of breast cancer and a cousin with breast cancer. Mother and sister had bone cancer.   REVIEW OF SYSTEMS: Negative for fever. She does have general weakness. No double vision, tinnitus, ear pain or epistaxis. She does have a chronic cough, currently worse with associated wheezing. She denies midsternal chest pain. She denies any nausea, vomiting, diarrhea or melena.   PHYSICAL EXAM:  VITAL SIGNS: Temperature is 98.1. Pulse 96. Blood pressure is 114/57.  GENERAL: Elderly, well-developed, well-nourished female in no acute distress but obviously coughing intermittently.  HEAD AND FACE: Head is normocephalic, atraumatic. There are no facial skin lesions. Facial strength is normal and symmetric. Ears: External ears, ear canals, tympanic membranes are clear bilaterally. There is no middle ear effusion or infection. Nasal exam: Nasal mucosa is minimally congested. Secretions are clear. No purulence is seen. There are no polyps. Oral cavity and oropharynx: Lips, gums, tongue, floor of mouth and posterior pharynx are clear. There is no source of bleeding seen. No fresh blood seen in the throat.  NECK: Supple without adenopathy or mass. There is no thyromegaly. Salivary glands are soft and nontender.  RESPIRATORY: Lungs reveal bilateral wheezing with rhonchi noted in the left lung base. She has a congested-sounding cough.  NEUROLOGIC: Cranial nerves II through XII are grossly intact.   PROCEDURE NOTE:  PREOPERATIVE DIAGNOSIS: Hemoptysis with history of  epiglottic lesion.   POSTOPERATIVE DIAGNOSIS: Chronic otitis media.   PROCEDURE: Flexible fiberoptic nasal laryngoscopy.   SURGEON: Ollen Grossaul S. Willeen CassBennett, M.D.   ANESTHESIA: Topical.   DESCRIPTION OF PROCEDURE: After discussing the procedure with the patient, the right nasal cavity was anesthetized with topical 4% lidocaine. A flexible fiberoptic scope was passed through the right nasal cavity. The nasal cavity and nasopharynx were free of any sources of bleeding. Hypopharynx, larynx and tongue base were inspected. Her vocal cords were a little red from coughing but no visible lesions were seen on the cords. Just to the left of midline, there is maybe a 3 mm vascular-appearing lesion of the tip of the epiglottis. There is no associated ulceration. There is no scabbing or active bleeding from the lesion. The lining over it appears intact. Tongue base and hypopharynx are otherwise clear with no visible blood in the airway and no other source for active bleeding identified.   DATA REVIEW: The patient's hemoglobin is normal at 12.4 with a platelet count of 256. Her INR is 1. Her chest x-ray showed mild basilar opacities, possibly due to atelectasis; however, her CT scan showed some scattered foci of ground-glass opacity in the right upper lobe which was nonspecific and a small hyperenhancing nodule in the spleen which is stable from previous evaluation. There was also some sclerotic change in the vertebral body and diffuse mild thickening of the esophageal wall. There was nothing specific seen in the left lower lung where she is having her chest pain, and there was no hilar lymphadenopathy.   ASSESSMENT: [name] has a small lesion of the epiglottis which has a vascular appearance. It is possible it could represent the source of her hemoptysis, although there is no active bleeding currently. In addition, she has left-sided chest pain. While this may have been considered caused by her fall, she has protracted  coughing yesterday, worse than her normal chronic cough and now is coughing up purulent sputum. Despite the results of her CT scan, I think the sputum should be sent for culture. I will inform Dr. Jacques NavyAhmadzia to see whether he wants to prophylactically start antibiotics prior to obtaining the culture results given her symptoms. Whether the lungs could remain a potential source for hemoptysis is uncertain; however, I would recommend the patient follow up with Dr. Chestine Sporelark after further treatment here to discuss whether he wants to move ahead with excision of the epiglottic lesion using the CO2 laser. Obviously, her underlying pulmonary condition needs to be stabilized prior to considering any surgical intervention.    ____________________________ Ollen GrossPaul S. Willeen CassBennett, MD psb:gb D: 10/12/2012 19:26:45 ET T: 10/12/2012 21:07:19 ET JOB#: [id number]  cc: Ollen GrossPaul S. Willeen CassBennett, MD, <Dictator> Sandi MealyPAUL S Karolina Zamor MD ELECTRONICALLY SIGNED 10/15/2012 18:46

## 2014-09-10 NOTE — Discharge Summary (Signed)
PATIENT NAME:  Yolanda Price, Yolanda G MR#:  [id number] DATE OF BIRTH:  01/12/1946  DATE OF ADMISSION:  05/01/2013 DATE OF DISCHARGE:  05/04/2013  PRESENTING COMPLAINT: Back pain, dysuria and foul-smelling urine.   DISCHARGE DIAGNOSES:  1.  Recurrent urinary tract infection, Pseudomonas.  2.  Systemic inflammatory response syndrome secondary to urinary tract infection.  3.  Acute on chronic interstitial cystitis.  4.  Hyponatremia due to poor intake result.  5.  Chronic back pain.   CODE STATUS: No code, DNR.   DISCHARGE MEDICATIONS:   1.  Lovaza 1000 mg 2 capsules 2 times a day.  2.  Amitriptyline 100 mg 1 tablet daily.  3.  Bupropion 300 mg XL p.o. daily.  4.  Clonazepam 2 mg b.i.d.  5.  Guaifenesin 600 mg extended-release 1 tablet b.i.d.  6.  Advair 250/50 mg 1 puff b.i.d.  7.  Pro-Air HFA 90 mcg per inhalation 1 puff every 4 to 6 hours daily as needed.  8.  Atorvastatin 40 mg daily.  9.  Mag-Ox 400 mg b.i.d.  10. Aspirin 81 mg daily.  11. Acidophilus 1 tablet p.o. daily.  12. K-Dur 20 mEq b.i.d.  13. Ketoconazole topical 2% cream apply to affected area once a day.  14. Digoxin 0.125 mg p.o. daily.  15. Synthroid 88 mcg 1/2 tablet, that is 44 mcg p.o. daily.  16. Spiriva 18 mcg inhalation daily.  17. Colace 100 mg 2 capsules twice a day.  18. Ferrous sulfate 325 mg 1 tablet b.i.d.  19. Abilify 10 mg p.o. daily at bedtime.  20. Coreg 3.125 mg b.i.d.  21. Duo-Neb 3 mL every 6 hours as needed.  22. Protonix 40 mg b.i.d.  23. Linzess 219 mcg p.o. daily for IBS.  24. Senna 1 tablet b.i.d.  25. Lexapro 20 mg daily.  26. Lisinopril 2.5 mg daily.  27. Morphine IR 10 mg b.i.d. p.r.n.  28. Torsemide 20 mg daily.  29. Levaquin 500 mg daily.  30. Percocet 7.5/325 mg 1 every 6 hours as needed.   DISCHARGE INSTRUCTIONS: Home health and PT. Oxygen 2 L nasal cannula.   FOLLOWUP: With Dr. Carlynn PurlSowles in 1 to 2 weeks. Follow up with your urologist in 1 to 2 weeks, daughter to make appointment.    LABS: Urine culture positive for Pseudomonas. Glucose is 82, BUN 4, creatinine is 0.61, sodium is 134, potassium is 4.4, chloride is 98, bicarbonate is 31, calcium is 9.2. White count is 9.6, hemoglobin and hematocrit is 12 and 36.4, platelet count is 290. Blood cultures negative in 48 hours. UA positive for UTI. White count on admission was 16.3.   HOSPITAL COURSE: Yolanda Price is a 69 year old Caucasian female with multiple medical problems including recurrent UTIs, chronic pain, hypertension, GERD and depression, who came to the hospital because of back pain, dysuria and suspected urinary tract infection. She was admitted with:  1.  SIRS secondary to recurrent UTIs, Pseudomonas. Urine culture grew Pseudomonas. She was started on IV Levaquin, changed to p.o. and will finish up a 10-day course. The patient remained hemodynamically stable and received IV fluids.  2.  Acute on chronic interstitial cystitis. The patient has had several UTIs in the past. She will follow up with her urologist as outpatient and daughter will make an appointment. She may benefit from getting a cystoscopy done.  3.  Hyponatremia, likely due to dehydration and underlying SIRS, resolved after IV fluids.  4.  Depression. Continue Cymbalta, Abilify and Lexapro.  5.  Hypertension, on  lisinopril and Coreg.  6.  COPD. No acute exacerbation. Continue Spiriva and p.r.n. Duo-Nebs.  7.  Hyperlipidemia, on atorvastatin.  8.  Generalized deconditioning and weakness. The patient was seen by PT, who recommended home PT, which was set up prior to discharge.  9.  Hospital stay otherwise remained stable.   TIME SPENT: 40 minutes.   CODE STATUS: The patient remained a no code, DNR.  ____________________________ Wylie Hail. Allena Katz, MD sap:aw D: 05/05/2013 06:48:33 ET T: 05/05/2013 07:15:05 ET JOB#: [id number]  cc: Nikea Settle A. Allena Katz, MD, <Dictator> Onnie Boer. Carlynn Purl, MD Willow Ora MD ELECTRONICALLY SIGNED 05/07/2013 10:57

## 2014-09-10 NOTE — Discharge Summary (Signed)
PATIENT NAME:  Yolanda Price, Yolanda Price MR#:  [id number] DATE OF BIRTH:  Nov 23, 1945  DATE OF ADMISSION:  11/26/2012 DATE OF DISCHARGE:  11/29/2012  ADMITTING DIAGNOSIS: Likely stroke.  DISCHARGE DIAGNOSES: Suspect acute stroke, likely thrombotic with right-sided numbness but no weakness. History of hypertension, hypothyroidism, chronic obstructive pulmonary disease, chronic respiratory failure (oxygen-dependent), deep vein thrombosis, chronic diastolic congestive heart failure, hyperlipidemia, constipation, depression.   DISCHARGE CONDITION: Stable.   DISCHARGE MEDICATIONS: The patient is to resume her outpatient medications, which are amitriptyline 100 mg p.o. at bedtime, Abilify 10 mg p.o. at bedtime, clonazepam 1 mg twice daily, bupropion XL 300 mg p.o. once daily, aspirin 81 mg p.o. daily, Crestor 10 mg p.o. daily, digoxin 125 mcg p.o. daily, Lexapro 10 mg p.o. daily, lisinopril 2.5 mg p.o. daily, Lovaza 1000 mg capsules 2 capsules twice daily, omeprazole 20 mg p.o. twice daily, Os-Cal with vitamin D 1 tablet twice daily, Spiriva 1 inhalation once daily, alendronate 70 mg p.o. weekly on Sundays, Symbicort 160/4.5 two puffs twice daily,  Synthroid 88 mcg p.o. daily, prednisone as previously scheduled, carvedilol 6.25 mg twice daily, furosemide 10 mg p.o. 1/2 tablet daily as needed, ProAir HFA 2 puffs every 4 to 6 hours as needed, albuterol SVNs 2.5 mg in 3 mL inhalation solution 1 inhalation every 4 to 6 hours as needed, Bactrim 400/80 one tablet once a day as previously scheduled, aspirin/dipyridamole 25/200 one capsule twice a day. The patient is to use aspirin/dipyridamole with aspirin 81 mg initially and then discontinue aspirin completely  when her headache is less pronounced.   HOME HEALTH: None.   HOME OXYGEN: Two liters of oxygen through nasal cannula 24/7.  DIET: Two gram salt, low-fat, low-cholesterol, mechanical soft consistency.   ACTIVITY LIMITATIONS: As tolerated.   FOLLOWUP APPOINTMENT:  With Dr. Carlynn PurlSowles in 2 days after discharge.   CONSULTANTS: Care management.   RADIOLOGIC STUDIES: Chest x-ray done on 11/26/2012, portable single view, revealed probable fibrosis or subsegmental atelectasis at the right lung base. Portable chest x-ray single view in the evening on the same day, 11/26/2012 revealed probable fibrosis or subsegmental atelectasis at the right lung base. CT scan of head without contrast, 11/26/2012, revealed no acute abnormality. Repeated CT of head, 11/28/2012, with no contrast showed atrophy with chronic microvascular ischemic disease. No acute intracranial abnormality was noted. Ultrasound of carotid arteries, 11/28/2012, showed no sonographic evidence of hemodynamically significant stenosis within the right or left carotid systems. Echocardiogram, 11/27/2012, revealed left ventricular ejection fraction by visual estimation more than 75%, elevated mean left arterial  pressure, impaired relaxation pattern of left ventricular diastolic filling, mildly increased left ventricular posterior wall thickness, no thrombus seen.   The patient is a 69 year old Caucasian female with past medical history significant for history of hypothyroidism, history of hypertension as well as hyperlipidemia, who presented to the hospital with complaints of right-sided numbness but no weakness. Please refer to Dr. Clarita LeberVasireddy's admission note on 11/26/2012. On arrival to the hospital, the patient's temperature was 98.7. Pulse was 94. Respiration rate was 16, blood pressure 128/69. Saturation was 97% on room air. Physical exam was unremarkable.   The patient's CT scan of head was unremarkable. The patient's lab data done the same day revealed normal BMP, except for mild elevation of BUN to 27 and glucose to 108. Liver enzymes showed elevation of AST as well as ALT to 46 and 79 respectively. Albumin level was 3.2. Cardiac enzymes x 3 showed no abnormalities. TSH was normal at 1.19. The patient's white blood  cell count was mildly elevated to 13.4; hemoglobin was 12.9 and platelet count was 327. Urinalysis was unremarkable. The patient's EKG showed electronic ventricular pacemaker at a rate of 106 beats per minute. When compared to prior EKG done in June 2014, the patient's ventricular rate was increased by 36 beats per minute. The patient's chest x-ray was also unremarkable.  The patient was admitted to the hospital for further evaluation. First, in regards to right-sided numbness, it was felt that the patient's right-sided numbness was related to a stroke. However, the patient had no weakness or any other symptoms. Her symptom of numbness did not  completely resolve;  however, it improved as time progressed. It was felt that the patient would benefit from Aggrenox use twice a day. The patient was initiated on Aggrenox; however, with Aggrenox, she had ongoing headaches, so we advised her to start Aggrenox with a low dose of aspirin. If she, however, continues to have significant headaches, her Aggrenox should be changed to Plavix and aspirin should be discontinued at that point.   In regards to hypertension, the patient's blood pressure was well controlled. The patient is to continue her usual outpatient medications.   The patient's lipid panel was checked while she was in the hospital and the patient's LDL was found to be 75. The patient's total cholesterol was 198, triglycerides were 58 and HDL was 111. It was felt that the patient's cholesterol was satisfactorily controlled and continued therapy with low-fat, low-cholesterol diet as well as Lovaza was recommended. The patient may benefit, however, from statin.   The patient was assessed by physical therapist while she was in the hospital and recommended to be discharged to home. On day of discharge, the patient felt satisfactory. Did complain of some  numbness on her right side. Her vitals, however, were stable with temperature of 97.7, ranging to 99.2. Pulse  was 70s to 100. Respiration rate was 16 to 18. Blood pressure was around 109 to 130s systolic and 70s diastolic, and O2 sats were 96% on 2 liters of oxygen through nasal cannula and 94 on room air.   In regards to hypothyroidism, the patient's TSH was in good level. The patient is to continue her usual dose of Synthroid.  In regards to chronic respiratory failure, COPD, she is to continue her outpatient management.   For her history of DVT, the patient was recommended no significant therapies.  For history of chronic diastolic CHF, the patient was not in exacerbation. She is to continue her usual outpatient management.  For history of constipation as well as depression, the patient is to continue her outpatient management. No changes were made.   The patient is being discharged in a stable condition with the above-mentioned medications and followup.   TIME SPENT: 40 minutes.   ____________________________ Katharina Caper, MD rv:jm D: 11/29/2012 16:40:49 ET T: 11/29/2012 19:03:32 ET JOB#: [id number]  cc: Katharina Caper, MD, <Dictator> Onnie Boer. Carlynn Purl, MD Bryson Palen Winona Legato MD ELECTRONICALLY SIGNED 12/03/2012 9:00

## 2014-09-10 NOTE — Consult Note (Signed)
Brief Consult Note: Diagnosis: Pneumonia with CRE pseudomonas,End stage COPD.   Patient was seen by consultant.   Consult note dictated.   Recommend further assessment or treatment.   Orders entered.   Discussed with Attending MD.   Comments: Would change to cipro 750 bid for 21 day course cont management of her severe copd - will arrange otpt fu in 3 weeks time to decide on need for further abx  Thank you for consult. Will follow with you.  Electronic Signatures: Dierdre HarnessFitzgerald, Garion Wempe Patrick (MD)  (Signed 11-Aug-14 13:19)  Authored: Brief Consult Note   Last Updated: 11-Aug-14 13:19 by Dierdre HarnessFitzgerald, Rayla Pember Patrick (MD)

## 2014-09-10 NOTE — Consult Note (Signed)
PATIENT NAME:  Yolanda Price, Yolanda Price MR#:  [id number] DATE OF BIRTH:  [birth year]  DATE OF CONSULTATION:  02/26/2013  REFERRING PHYSICIAN:   CONSULTING PHYSICIAN:  Zackery Barefoot, MD  HISTORY OF PRESENT ILLNESS: The patient is a 69 year old white female whose health has been declining over the past year, and she has been hospitalized multiple times with increasing frequency. She was most recently admitted about 2-1/2 weeks ago for syncope and dehydration, as well as Pseudomonas pneumonia. She has chronic COPD, hypertension, anemia of chronic disease and notably underwent CO2 laser ablation of an epiglottic tip vascular lesion back in June by Dr. Willeen Cass. The patient's family reports that the patient has done well since that laser ablation, but she became more confused and ill over the past several days, and she had some cough this afternoon that was productive of blood and mucus. She is pending admission by Dr. Delfino Lovett. The patient's daughter reports that she missed a followup with Dr. Willeen Cass and has not followed up with Dr. Freda Munro since her last admission   PAST MEDICAL HISTORY: Ongoing treatment for Pseudomonas aeruginosa carbapenem resistant, Enterobacter pneumonia, COPD, chronic respiratory failure, chronic T4 fracture wheelchair dependent, chronic constipation, history of Afib, congestive heart failure, AICD placement, history of CVA, hyperlipidemia, anxiety and depression, hypothyroidism, chronic pain syndrome on chronic narcotics.   ALLERGIES: CODEINE.   MEDICATIONS: Furosemide, Lovaza, Lexapro, Cipro, Crestor, carvedilol, Symbicort, ProAir HFA, lisinopril, digoxin, clonazepam, aspirin, amitriptyline Protonix, Spiriva HandiHaler, Abilify, bupropion, Synthroid, oxygen 2 liters, Linzess, DuoNeb, Plavix, metolazone, prednisone, benzonatate , oxycodone.   PAST SURGICAL HISTORY:  1. CO2 laser ablation epiglottic lesion in June.  2. AICD placement.  3. Appendectomy.  4. Cholecystectomy.  5.  Benign breast tumor excision.  6. Partial hysterectomy.   SOCIAL HISTORY: Quit smoking in March 2014. Lives with her daughter.   REVIEW OF SYSTEMS:  CONSTITUTIONAL: Low-grade fever, fatigue, weakness.  ENT: No tinnitus, ear pain, hearing loss. No trouble breathing through her nose.  RESPIRATORY: Positive for cough, shortness of breath, COPD.  CARDIOVASCULAR: No chest pain.   PHYSICAL EXAM:  GENERAL: Morbidly obese, anxious, mildly tachypneic. No audible stridor.  NOSE: Nasal mucosa is dry. There are no active lesions in the nose. No bleeding.  ORAL CAVITY AND OROPHARYNX: The upper denture is encrusted with blood. No fresh blood. The tongue is also coated with dried blood.   NECK: The trachea is midline. There are no palpable masses or lesions in the neck.  EARS: The external auditory canals are clear. There is no blood in the middle ear.   Flexible fiberoptic laryngoscopy reveals no active bleeding in the nasal cavity. The nasopharynx is without masses or lesions. No vascular lesions in the nasopharynx. The endolarynx is significant for dark blood-tinged secretions in the piriform sinuses bilaterally (partially clears with swallowing). The epiglottis is without lesions or masses. No significant telangiectasia. The vocal cords abduct and adduct normally. There are no other endolaryngeal masses or lesions. No masses or lesions visualized in the subglottis.   IMPRESSION: Hemoptysis, likely secondary to pulmonary source. I do not see any evidence of source in the upper aerodigestive tract, and given the known history of Pseudomonas and Enterobacter pneumonia, I think that the lower airway is the most likely source. I would recommend room air humidifier or blow-by humidification with oxygen for lubrication of mouth, nose and airway.    ____________________________ J. Gertie Baron, MD jmc:gb D: 02/26/2013 19:06:51 ET T: 02/26/2013 21:15:15 ET JOB#: [id number]  cc: Zackery Barefoot,  MD,  <Dictator> Curly RimSonja Thompson - Practice Administrator Wendee CoppJMADISON Viraaj Vorndran MD ELECTRONICALLY SIGNED 03/19/2013 20:34

## 2014-09-10 NOTE — Consult Note (Signed)
Brief Consult Note: Diagnosis: delirium due to medical conditions.   Patient was seen by consultant.   Consult note dictated.   Discussed with Attending MD.   Comments: Psychiatry: Patient seen, chart reviewed and spoke with Dr Allena KatzPatel and nursing. Patient has been delirious but not dangerous for last several days. Seems to be resolving. Multiple possible causes. Not currently dangerous. No clear need to stay in the hospital. [facility].  Electronic Signatures: Audery Amellapacs, Taiesha Bovard T (MD)  (Signed [id number]-Nov-14 17:43)  Authored: Brief Consult Note   Last Updated: 19-Nov-14 17:43 by Audery Amellapacs, Bradie Lacock T (MD)

## 2014-09-10 NOTE — H&P (Signed)
PATIENT NAME:  Yolanda Price, Yolanda Price MR#:  [id number] DATE OF BIRTH:  1946/01/28  DATE OF ADMISSION:  05/01/2013  PRIMARY CARE PHYSICIAN: Dr. Carlynn Purl at [facility] medical practice.   CHIEF COMPLAINT: Back pain, dysuria, and foul-smelling discolored urine for about a week.   HISTORY OF PRESENT ILLNESS: Yolanda Price is a 69 year old Caucasian female, well-known to our service from previous many admissions, who comes to the Emergency Room with her daughter from home after she was noted to have ongoing back pain and abdominal cramping with dysuria, foul-smelling, cloudy urine.   The patient was recently seen in the Emergency Room December 7 when her UA was positive for 4500 white blood cells. She was sent home on Vantin which is a third-generation cephalosporin, 1 tablet twice a day for 10 days. The patient has still got a couple more days of antibiotics left. Her urine continued to be cloudy and discolored orange and continued to have ongoing back pain and cramping.   She came to the Emergency Room, found to have significant UTI with white cell count of 2900 with positive leukocyte esterase and nitrite. She also had a white count elevated of 16.9 and significant back pain. Her urine culture from December 7 grew pseudomonas which is resistant to cephalosporins. The patient is now thereby being admitted for SIRS secondary to acute severe cystitis/UTI.   The patient has history of chronic interstitial cystitis for 20+ years. She has been on and off on intermittent long-term courses of prophylactic antibiotic; however, during the last several months, the patient has been in and out of the hospital, was recently diagnosed with MRSA pneumonia and she also has VRE positive. She recently in November finished up a course of Zyvox for MRSA pneumonia. The patient for some reason has been off the prophylactic antibiotics for her UTI. She has seen urology a long time ago.   PAST MEDICAL HISTORY:  1.  Chronic obstructive  pulmonary disease, on home oxygen.  2.  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 Last serum sodium is 133 in November 2014.  3.  Recurrent UTIs.  4.  History of melanoma.  5.  History of pacemaker.  6.  Hypertension.  7.  History of DVT in the past.  8.  History of MRSA pneumonia.  9.  Chronic anxiety, depression.  10.  Chronic constipation. The patient is taking Linzess.   11.  Chronic back pain.  12.  Chronic AFib, not on anticoagulation.  13.  History of chronic diastolic heart failure.  14.  History of CVA.  15.  Hypothyroidism.  16.  Hyperlipidemia.  17.  CAD.  18.  Chronic pain syndrome, on narcotics.   PAST SURGICAL HISTORY:  1.  Epiglottic lesion laser surgery by ENT.  2.  AICD placement.  3.  Appendectomy.  4.  Cholecystectomy.  5.  Benign breast tumor removal.  6.  Partial hysterectomy.   ALLERGIES: CODEINE; HOWEVER, SHE IS ABLE TO TOLERATE MORPHINE AND DILAUDID.   SOCIAL HISTORY: She is a former smoker of 53 years, quit in March 2014. No alcohol. No illicit drug use. Lives with her daughter. She uses oxygen chronically.   CODE STATUS: NO CODE, DNR. This was confirmed with the patient's daughter who is the healthcare power of attorney.   FAMILY HISTORY: From old records, father died of MI. Mother died of MI. Sister has bone cancer.   REVIEW OF SYSTEMS: CONSTITUTIONAL: Positive for fatigue, weakness and back pain.  EYES: No blurred or  double vision, cataracts or glaucoma.  ENT: No tinnitus, ear pain, hearing loss, postnasal drip.  RESPIRATORY: No cough, wheeze, hemoptysis, pneumonia.  CARDIOVASCULAR: No chest pain, orthopnea, edema. Positive for chronic COPD and relative hypotension.  GASTROINTESTINAL: No nausea, vomiting, diarrhea. Positive for lower abdominal pain and back pain. Positive for GERD.  GENITOURINARY: Positive for dysuria, frequency along with urine discoloration.  ENDOCRINE: No polyuria, nocturia or thyroid  problems.  HEMATOLOGY: No anemia or easy bruising or bleeding.  SKIN: No acne or rash.  MUSCULOSKELETAL: Positive for arthritis and no swelling or gout.  NEUROLOGIC: No CVA, TIA, ataxia or dementia.  PSYCHIATRIC: Positive for anxiety. No bipolar disorder or depression.   All other systems reviewed and negative.   PHYSICAL EXAMINATION:  GENERAL: Awake, alert, oriented x3, in mild to moderate distress due to abdominal pain and back pain.  VITAL SIGNS: Temperature is 97.5. Pulse is 88. Blood pressure is 106/75. Sats are 95% on 3 liters.  HEENT: Atraumatic, normocephalic. PERRLA. EOMI intact. Oral mucosa is dry.  NECK: Supple. No JVD. No carotid bruit.  RESPIRATORY: Clear to auscultation bilaterally. No rales, rhonchi, respiratory distress or labored breathing.  CARDIOVASCULAR: Both heart sounds are normal. Rate and rhythm regular. PMI not lateralized. Chest is nontender.  EXTREMITIES: Good pedal pulses, good femoral pulses. No lower extremity edema.  ABDOMEN: Soft. There is some diffuse tenderness in the lower abdomen. No guarding, rigidity, or any mass felt. Bowel sounds are feeble.  NEUROLOGIC: Grossly intact cranial nerves II through XII. No motor or sensory deficit.  PSYCHIATRIC: Awake, alert, oriented x3.   MEDICATION LIST:  1.  Torsemide 20 mg two tablets twice a day.  2.  Synthroid 44 mcg p.o. daily.  3.  Spiriva 80 mcg inhalation daily.  4.  Senokot 1 tablet b.i.d.  5.  Protonix 40 mg b.i.d.  6.  ProAir HFA 1 puff every four to 6 hours.  7.  Potassium chloride 20 mEq b.i.d.  8.  Percocet 7.5/325, one tablet every 6 hours as needed.  9.  Mag-Ox 400 mg one tablet b.i.d.  10.  Lovaza 1000 mg two capsules twice a day.  11.  Linzess 290 mcg p.o. daily.  12.  Lexapro 20 mg p.o. daily.  13.  Ketoconazole topical 2% to affected area.  14.  Guaifenesin 600 mg one tablet b.i.d.  15.  Ferrous sulfate 325 mg one tablet b.i.d.  16.  DuoNebs 3 mL every 6 hours.  17.  Digoxin 125 mcg p.o.  daily.  18.  Colace 100 mg two capsules b.i.d.  19.  Clonazepam 2 mg b.i.d.  20.  Vantin 100 mg b.i.d. This was for UTI on December 7 for 10 days.  21.  Bupropion 300 mg one p.o. daily.  22.  Atorvastatin 40 mg p.o. daily.  23.  Aspirin 81 mg daily.  24.  Amitriptyline 100 mg p.o. daily.  25.  Advair 250/50 one puff b.i.d.  26.  Acidophilus 1 tablet p.o. daily.  27.  Abilify 10 mg p.o. daily at bedtime.   UA positive for UTI.   White count of 16.3.   Basic metabolic panel within normal limits except sodium of 123 and chloride of 87.  Lipase is 117.   Urine cultures on December 7 grew Pseudomonas.   ASSESSMENT: Yolanda Price, a 69 year old, is with history of anxiety, depression, chronic obstructive pulmonary disease on home oxygen, chronic interstitial cystitis, and comes in with:  1.  Systemic inflammatory response syndrome secondary to urinary tract infection.  The patient will be started on IV Levaquin given her urine culture on April 26, 2013, is growing pseudomonas which is sensitive to Levaquin, meropenem, and ciprofloxacin. We will await cultures from today. Follow blood cultures. Follow up white count which is elevated to 16,000. We will also have the patient be seen by urologist for her recurrent urinary tract infection. She will benefit from chronic suppressive urinary tract infection medications. 2.  Acute-on-chronic interstitial cystitis. The patient has a long-standing history of interstitial cystitis. She used to follow up Dr. Garner Nashaniels from urology in the past. She has been on and off on urinary tract infection prophylaxis, however, lately has not been on it. She will benefit from urinary tract infection prophylactic, antibiotic after she finishes up the course this time. Will have urology see the patient. I am not sure if she would benefit from any cystoscopic evaluation as outpatient once her urine urinary tract infection treatment is completed.  3.  Dehydration clinically  with acute-on-chronic hyponatremia. The patient's baseline sodium is 133. She is down to 123 with low chloride suggesting dehydration and poor p.o. intake. We will give her IV fluids and follow up her sodium levels. 4.  Relative hypotension. Will give IV fluids. The patient is on torsemide which I will hold off. Will also continue her lisinopril. If her blood pressure remains too low, I will hold it.  5.  Chronic anxiety, depression. Will continue her bupropion, clonazepam, Abilify, her Lexapro.  6.  Hypothyroidism. We will continue Synthroid.  7.  Chronic obstructive pulmonary disease on chronic home oxygen with chronic respiratory failure, 2 liters. We will continue nebulizer treatments and inhalers.  8.  Hyperlipidemia, on Lovaza.  9.  Further workup per the patient's clinical course.   Hospital admission plan was discussed with the patient and the patient's daughter, Yolanda Price, who is the healthcare power of attorney.   TIME SPENT: 50 minutes.   ____________________________ Wylie HailSona A. Allena KatzPatel, MD sap:np D: 05/01/2013 16:39:17 ET T: 05/01/2013 17:15:58 ET JOB#: [id number]  cc: Rylon Poitra A. Allena KatzPatel, MD, <Dictator> Onnie BoerKrichna F. Carlynn PurlSowles, MD Willow OraSONA A Divit Stipp MD ELECTRONICALLY SIGNED 05/07/2013 10:57

## 2014-09-10 NOTE — Consult Note (Signed)
PATIENT NAME:  Yolanda Price, [name] MR#:  [id number] DATE OF BIRTH:  1945-10-22  DATE OF CONSULTATION:  02/11/2013  CONSULTING PHYSICIAN:  Rodman Keyawn S. Harrison, NP and Lutricia FeilPaul Oh, MD  PULMONOLOGIST:  Dr. Freda MunroSaadat Khan.   PRIMARY CARDIOLOGIST: Dr. Marcina MillardAlexander Paraschos.   ATTENDING PHYSICIAN:  Dr. Elpidio AnisSudini   REASON FOR CONSULTATION:  Iron deficiency anemia.   HISTORY OF PRESENT ILLNESS: Yolanda Price is a 69 year old Caucasian female with a history of multiple admissions, average once a month since May 2014. She was recently admitted 08/07 and discharged on 08/13 for the complaint of pseudomonas carbapenem resistant pneumonia. The patient had completed her first course of Cipro 750 mg b.i.d. for 21 days but  continued  with a cough. She was given a second round of ciprofloxacin 750 mg b.i.d. which was prescribed by Dr. Freda MunroSaadat Khan.  She recently tapered off of prednisone for a known history of chronic COPD. She was also recently started on metolazone by Dr. Darrold JunkerParaschos on an outpatient basis, as well as her Lasix was increased. Unfortunately, she sustained a fall on the day in which she was admitted. At that time also complaint of lingering cough and she had a low-grade fever that morning. Because of being found to have hypernatremia, likely medication reduce as related to her diuretics and poor p.o. intake and vomiting for the last 2 days she was thus admitted. The patient states she did hit her head and sustained loss of consciousness as well as injury to left arm, a significant amount of bruising noted. States that last week after being started on medication by Dr. Darrold JunkerParaschos and changes in Lasix that she started to feel bad. Did not vomit until she arrived here. She states it was dark-color, coffee-ground-like emesis. Bowel movement had been on a daily basis but has not had a bowel movement since Sunday. No evidence of bright red blood. Significant for exacerbation of reflux which is not well controlled with Protonix 40 mg  twice a day. She was scheduled on an outpatient basis several months ago to have an EGD done to allow direct luminal evaluation of upper GI tract for the indication of exacerbation of reflux. Upper GI symptoms not being well controlled. This had not been done unfortunately as she is persistently being admitted to the hospital. Able to review this, she did have upper endoscopy by Dr. Lutricia FeilPaul Oh, October 2013 with esophagus being dilated though there was no abnormality noted. Normal stomach and duodenum. Recommendation for the EGD being repeated was anemia. Colonoscopy was also done on this date with findings of multiple polyps being resected from rectosigmoid colon, transverse and cecum. All polyps resected except those in the rectosigmoid area revealed to be hyperplastic. The patient continues with symptoms of dysphagia, occurring on a regular basis with solid foods. She had been on a clear liquid diet since being admitted and been doing well until she ate oatmeal this morning.   PAST MEDICAL HISTORY: Ongoing treatment for Pseudomonas aeruginosa, carbapenem resistant Enterobacter pneumonia, COPD, chronic respiratory failure, dependent O2 nasal cannula, chronic T4 fracture, chronic constipation, history of atrial fibrillation, congestive heart failure, chronic AICD placement, history of CVA, hyperlipidemia, anxiety, depression, hypothyroidism, chronic pain syndrome.   ALLERGIES: CODEINE.   HOME MEDICATIONS:  Abilify 10 mg once a day, acetaminophen with oxycodone 325/7.5 mg 3 times a day, albuterol 2.5 mg/3 mL solution inhalation every 4 to 6 hours, amitriptyline 100 mg once a day, aspirin 81 mg a day, benzonatate 100 mg 1 capsule 3 times  daily, bupropion 300 mg extended-release once a day, carvedilol 6.25 mg twice a day, ciprofloxacin 750 mg twice a day for 21 days, clonazepam 2 mg once a day, Plavix 75 mg once a day, Crestor 10 mg once a day, digoxin 125 mcg once daily, furosemide 20 mg tablet 1/2 tablet once a  day, Lexapro 20 mg once a day, Linzess 290 mg once a day, lisinopril 2.5 mg once a day, Lovaza 1000 mg capsules 2 capsules twice a day, metolazone 5 mg once a day, Protonix 20 mg 1 tablet twice a day, prednisone 10 mg tablets taper as directed. ProAir 90 mcg inhalation 2 puffs every 4 to 6 hours as needed, Spiriva 18 mcg inhalation 1 capsule daily, sulfamethoxazole-trimethoprim 400 mg/80 mg 1 tablet once a day, Symbicort 160 mcg/4.5 mcg inhalation 2 puffs twice daily and Synthroid 88 mcg 0.5 tablets once a day.    FAMILY HISTORY: Father with history of MI. Mother deceased from an MI. Sister with bone cancer. No family history of breast cancer or colon cancer.   SOCIAL HISTORY: Remote tobacco use, quit March 2014. No alcohol or drug use.   REVIEW OF SYSTEMS:.  CONSTITUTIONAL: No fevers. Significant for fatigue and weakness.  EYES: No blurred vision, double vision, cataracts or glaucoma.  ENT: No tinnitus, ear pain or hearing loss.  RESPIRATORY: Significant for coughing, shortness of breath, COPD.  CARDIOVASCULAR: No chest pain, orthopnea or edema.  GASTROINTESTINAL: See history of present illness.  GENITOURINARY: No dysuria or hematuria.  ENDOCRINE: No nocturia, polyuria, thyroid conditions.  HEMATOLOGIC/LYMPHATIC: Denies easy bruising and bleeding.  SKIN: No lesions. No rashes.  MUSCULOSKELETAL: Significant for arthralgias.  NEUROLOGIC: History of CVA. No seizure disorder.  All 10 systems reviewed and checked, otherwise unremarkable unless stated above.   PHYSICAL EXAMINATION: VITAL SIGNS: Temperature 97.6 with a pulse of 101, respirations 18, blood pressure 110/46 with a pulse ox of 98% on O2.  CONSTITUTIONAL: Well-developed, well-nourished 69 year old Caucasian female, mild respiratory distress noted, hoarse voice. O2 in place.  HEENT: Normocephalic, atraumatic. Pupils equal and reactive to light. Conjunctiva clear.  Sclerae anicteric.  NECK: Supple. Trachea midline. No lymphadenopathy or  thyromegaly.  PULMONARY: Decreased breath sounds noted throughout. Scattered rales and rhonchi noted in almost entire left lung. Mild scattered rhonchi to right lower lobe. More aeration noted.  CARDIOVASCULAR: Regular rate and rhythm, S1, S2. No murmurs, no gallops.  ABDOMEN: Soft, nondistended. Mild tenderness noted throughout. No evidence of hepatosplenomegaly. Bowel sounds present. RECTAL: Deferred. According to the patient, rectal exam was performed in the Emergency Room and found to be heme-positive.  MUSCULOSKELETAL:  Moving all 4 extremities. No contractures. No clubbing.  EXTREMITIES: No edema.  PSYCH: Alert and oriented x 4. Memory grossly intact. Appropriate affect and mood.  NEUROLOGICAL: No gross neurological deficits noted.   LABORATORY AND DIAGNOSTIC DATA:  All laboratory and diagnostic imaging studies reviewed by myself during this hospitalization. Noted evidence of hyponatremia on admission, which is improved initially at level of 117 up to 126, potassium level has also been corrected. It declined down to 2.5.  Today's date 3.9. WBC count was elevated on admission 12.9, today 9.5. Hemoglobin has dropped from 9.0 to 7.1, hematocrit is 27.1 and has declined at 21.0. Blood cultures x 2 for 48 hours is unremarkable. Sputum culture: Excellent sputum noted, 90% to 100% WBCs, many white blood cells counted, a few gram-positive cocci, a few yeast noted. Urinalysis essentially within normal limits. CT of head without contrast performed due to fall. No acute  intracranial process noted. CT of chest without contrast mild increase in bibasilar densities raising concern for chronic infiltrates. Emphysema changes. Nonspecific findings of right lobe of the thyroid noted.    IMPRESSION:  Persistent Pseudomonas carbapenem resistant pneumonia. Known history of chronic obstructive pulmonary disease O2 dependent, dysphagia. Known history of reflux, anemia, heme-positive stool.   PLAN: The patient's  presentation is discussed with Dr. Lutricia Feil. Our recommendation is to proceed forward with an upper endoscopy but this is going to need to be decided when clinically feasible given her pulmonary status at this point in time. The patient will need to be evaluated by Dr. Bluford Kaufmann first. This was discussed with the patient who is in agreement. We will continue to monitor the patient during her hospitalization inclusive of laboratory studies. Recommend transfusion as necessary as well as involving blood and iron.   These services provided by Owens Shark, NP under collaborative agreement with Lutricia Feil, MD.    ____________________________ Rodman Key, NP dsh:dp D: 02/11/2013 14:49:00 ET T: 02/11/2013 16:13:03 ET JOB#: [id number]  cc: Rodman Key, NP, <Dictator> Rodman Key MD ELECTRONICALLY SIGNED 02/20/2013 13:33

## 2014-09-10 NOTE — Op Note (Signed)
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   PROCEDURE: Microlaryngoscopy with CO2 laser excision of epiglottic vascular lesion.   SURGEON: Anthoney HaradaPaul Scott Carma Dwiggins, MD   ANESTHESIA: General endotracheal.   INDICATIONS: The patient is with a history of recurrent hemoptysis. Bronchoscopy did not show an obvious source for this, but she was found to have a vascular lesion near the tip of the epiglottis on the left side.   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 There was no tissue available for biopsy; however, the lesion was completely ablated and all bleeding controlled.   COMPLICATIONS: None.   DESCRIPTION OF PROCEDURE: After obtaining informed consent, the patient was taken to the operating room and placed in the supine position. After induction of general endotracheal anesthesia with a laser-safe tube, the patient was turned 90 degrees and the head draped with wet towels with the eyes protected with moist gauze. A Dedo laryngoscope was then used to evaluate the hypopharynx, larynx and tongue base carefully. The pyriform sinuses were carefully inspected, and there was no source of any lesion. Vocal cords were found to be clear. Tongue base was inspected, and there was no evidence of any lesion involving the tongue base. The only lesion found was the small hemangioma on the tip of the epiglottis. The patient was placed into suspension with the epiglottis visualized through the Dedo laryngoscope. Photo documentation of the lesion was obtained with the 0-degree telescope. The microscope was moved into position and the CO2 laser attached. Anesthesia had the patient on room air to reduce risk of airway fires. The skin was  carefully rechecked to make sure it was completely covered with wet towels. The lesion was then ablated using the CO2 laser on a setting of 5 watts continuous. Char was suctioned away and minor bleeding controlled with an Afrin-moistened pledget followed by further laser ablation to make sure all feeding vessels were ablated and the lesion was completely destroyed. The Dedo laryngoscope was then removed and the laser tube exchanged for a 7.5 endotracheal tube to facilitate better ventilation, and she was returned to the anesthesiologist for awakening. She was awakened and taken to the recovery room in good condition postoperatively.   ____________________________ Ollen GrossPaul S. Willeen CassBennett, MD psb:cb D: 11/05/2012 11:29:31 ET T: 11/05/2012 14:26:52 ET JOB#: [id number]  cc: Ollen GrossPaul S. Willeen CassBennett, MD, <Dictator> Sandi MealyPAUL S Reine Bristow MD ELECTRONICALLY SIGNED 11/07/2012 21:51

## 2014-09-10 NOTE — Consult Note (Signed)
Pt seen and examined. Please see Yolanda Price's notes. Fe def anemia. Had polyps in 2013. EGD normal in 2013 but patient on ASA/plavix. O2 sat 93% on 2L. Plan EGD tomorrow afternoon if resp status stable. Pt aware if resp status worsens, then EGD will be postponed. Thanks  Electronic Signatures: Lutricia Feilh, Savanna Dooley (MD)  (Signed on 24-Sep-14 15:58)  Authored  Last Updated: 24-Sep-14 15:58 by Lutricia Feilh, Shenae Bonanno (MD)

## 2014-09-10 NOTE — H&P (Signed)
PATIENT NAME:  Yolanda Price, Yolanda Price MR#:  [id number] DATE OF BIRTH:  06-Jul-1945  DATE OF ADMISSION:  02/26/2013  PRIMARY CARE PHYSICIAN:  Dr. Alba Cory   REQUESTING PHYSICIAN:  Dr. Jene Every  CHIEF COMPLAINT: Mental status changes and spitting up blood and worsening breathing.   HISTORY OF PRESENT ILLNESS: The patient is a 69 year old female with a known history of chronic respiratory failure, on 3 liters oxygen continuous, with a recent admission for Pseudomonas pneumonia, is being admitted for possible sepsis due to pneumonia with acute on chronic respiratory failure and possible hemoptysis. The patient was recently in the hospital from 21st of September until 29th of September, when she was discharged to [facility] for pseudomonal pneumonia. She was doing well initially, but over last weekend she started getting worse. Her daughter felt the patient was getting a lot more swollen, her face was swollen, and lower extremities were also. They made an appointment for Dr. Darrold Junker. They took her last Tuesday and Dr. Darrold Junker changed her furosemide to torsemide, but patient's mental status now started getting worse. They also noticed that patient was having urinary incontinence and started wearing diapers. Especially when she coughed, her urine would leak. They also noticed that patient was spitting up some blood since last 2 days. Daughter also reported that patient's left arm has been hurting, and Percocet did not seem to help. Her arm has been hurting since her last admission, and Percocet seemed to help initially, but not anymore. Her daughter also reported that patient's breathing has slowly been getting worse over the last couple of days, especially since the weekend. She was prescribed ciprofloxacin for 21 days from discharge, but with all this ongoing concern, they brought her to the Emergency Department. She also reports having patient not talking as usual, and has been mumbling a lot more  than before, and could not finish talking on the phone completely, and at times does not make any sense. While in the ED, her chest x-ray showing right lung base pneumonia. She was found to be hypotensive, with a blood pressure of 75/60, followed by 95/60 after hydration, but she was found to have some spitting of the blood. The moist tissue placed in her mouth was completely red, and was found to be somewhat actively bleeding from her oral cavity, and is being admitted for further evaluation and management.   PAST MEDICAL HISTORY: 1.  Recent pseudomonas pneumonia.  2. COPD.  3.  Chronic respiratory failure, on 2 to 3 liters oxygen continuous.  4.  Chronic T4 fracture. 5.  Chronic constipation.  6.  History of chronic AFib, not on anticoagulation.  7.  Chronic diastolic heart failure.  8.  AICD placement. 9.  History of CVA.   10.  Hyperlipidemia.  11.  Anxiety/depression. 12.  Hypothyroidism.  13.  Chronic pain syndrome, on narcotics.   ALLERGIES:  CODEINE.   PAST SURGICAL HISTORY:   1.  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 Geanie Logan from ENT about a month ago. 2.  AICD placement.  3.  Appendectomy.  4.  Cholecystectomy. 5.  Benign breast tumor.  6.  Partial hysterectomy.   SOCIAL HISTORY:  She is a former smoker, quit in March 2014. No alcohol or other drug use. She lives at [facility] currently.    FAMILY HISTORY: Father died of MI. Mother died of MI. Sister had bone cancer. Also positive for breast cancer in the family.   MEDICATIONS AT HOME: 1.  Abilify  10 mg p.o. daily.  2.  Acetaminophen/oxycodone 325/5  mg p.o. every 4 to 6 hours as needed.  3.  Advair 250/50, 1 puff b.i.d.  4.  Amitriptyline 100 mg p.o. daily. 5.  Aspirin 81 mg p.o. b.i.d.  6.  Atorvastatin 40 mg p.o. daily. 7.  Bactrim 400/80, 1 tablet p.o. daily.  8.  Bupropion 300 mg p.o. daily.  9.  Coreg 3.125 mg p.o. b.i.d.  10.  Ciprofloxacin 750 mg p.o. b.i.d., which was prescribed on last  discharge.  11.  Clonazepam 2 mg p.o. b.i.d.  12.  Diflucan 200 mg p.o. daily.  13.  Digoxin 125 mcg p.o. daily.  14.  Colace 100 mg 2 capsules p.o. b.i.d.  15.  DuoNeb inhaled 4 times a day as needed.  16.  Feosol 325 mg p.o. b.i.d.  17. Guaifenesin 600 mg p.o. b.i.d.  18.  Klor-Con 20 mEq p.o. daily.  19.  Lexapro 20 mg p.o. daily.  20.  Linzess 290 mcg p.o. daily.  21.  Lovaza 1000 mg 2 capsules p.o. b.i.d.  22.  Protonix 40 mg p.o. b.i.d.  23.  ProAir 1 puff inhaled every 4 to 6 hours as needed.  24.  Spiriva once daily. 25.  Synthroid 88 mcg 1/2 tablet p.o. daily.  26.  Torsemide 20 mg 2 tablets p.o. daily, which was recently changed by Dr. Mick Sell last Tuesday from Lasix.   REVIEW OF SYSTEMS: CONSTITUTIONAL: Positive for fever, fatigue and weakness.  EYES: No blurry or double vision.  EARS, NOSE, THROAT: She is having some blood from back of her oral cavity, likely from her epiglottis lesion/hemoptysis.  RESPIRATORY: Positive for cough with some hemoptysis. Also positive for shortness of breath and recent pneumonia.  CARDIOVASCULAR: Worsening lower extremity edema. Some shortness of breath, mainly on exertion.  GASTROINTESTINAL: No nausea, vomiting, diarrhea.  GENITOURINARY: No dysuria or hematuria. The patient does have urinary incontinence. ENDOCRINE: No polyuria or nocturia. Positive for hypothyroidism.  HEMATOLOGY: No anemia or easy bruising.  SKIN: No rash or lesion.  MUSCULOSKELETAL: No arthritis or muscle cramp.  NEUROLOGIC: Some mental status changes, mainly slowing of her conversation pattern, at times mumbling. No tingling or numbness.  PSYCHIATRIC: No history of anxiety or depression.   PHYSICAL EXAMINATION: VITAL SIGNS: Temperature 100.3, heart rate 58 per minute, respiratory rate 19 per minute,  blood pressure was 75-60 and subsequently 93/60 mmHg. She is saturating 92% on 3 liters oxygen via nasal cannula.  GENERAL: The patient is a 69 year old female, lying in  bed in acute respiratory distress.  EYES: Pupils equal, round, react to light and accommodation. No scleral icterus. Extraocular muscles intact.  HEENT: Head atraumatic, normocephalic. Oropharynx showing fresh blood in the back of her oral cavity, covering the back of her throat. I put a wet tissue, which was completely soaked in fresh blood. I could not find any active bleeder, but certainly there was quite a bit of blood in the back of her oral cavity. Nasopharynx clean.  LUNGS: Decreased breath sounds at the bases. Accessory muscles of respiration used. Scattered wheezing throughout both lungs.  CARDIOVASCULAR: S1, S2 normal. No murmurs, rubs or gallops.  ABDOMEN: Soft, nontender, nondistended. Bowel sounds present. No organomegaly or masses.  EXTREMITIES:  There is 2+ pedal edema bilaterally. No cyanosis or clubbing.  NEUROLOGIC: Cranial nerves II through XII intact. Muscle strength 4/5 in all extremities. Sensation intact.  PSYCHIATRIC: The patient is alert and oriented x 3.  SKIN:  Warm and dry.   LABORATORY  PANEL:  Normal BMP, except sodium 135, potassium 3.4, chloride 96, blood sugar 111. LFTs showed alkaline phosphatase of 225, AST of 99. Normal first set of cardiac enzymes. TSH of 0.207. CBC showed white count 13.1, hemoglobin 9.7, hematocrit 29.7, platelets 250. UA is negative.   Chest x-ray in the ED showed right lower lobe pneumonia.   EKG showed paced rhythm.   IMPRESSION AND PLAN: 1. Clinical sepsis with fever, hypotension, mental status changes and leukocytosis, likely from pneumonia. Will start her on IV Levaquin and Zosyn, considering recent hospitalization with pseudomonal coverage.   2.  Acute on chronic respiratory failure, likely due to pneumonia with underlying chronic obstructive pulmonary disease and/or congestive heart failure.   3.  Acute on chronic diastolic heart failure, with a recent change in diuretic by Cardiology, Dr. Darrold JunkerParaschos. Will consult him. Start her on  IV twice a day of Lasix. Obtain 2-D echo. She may need pressors if her blood pressure does not improve or drops any further. Her lower extremity edema is getting worse. Her facial edema is somewhat better.   4.  Hemoptysis/bleeding in the back of her oral cavity. This seems more from the epiglottis region. I have counseled ENT, and Dr. Cyril LoosenKinner has discussed with Dr. Geanie LoganPaul Bennett, whose practice partner will come and see the patient for possible cauterization.   5.  Metabolic encephalopathy, likely due to sepsis.   6.  Code status:  DO NOT RESUSCITATE.  Total time taking care of this patient (critical care): 55 minutes.     ____________________________ Ellamae SiaVipul S. Sherryll BurgerShah, MD vss:mr D: 02/26/2013 18:10:23 ET T: 02/26/2013 19:27:54 ET JOB#: [id number]  cc: Davey Limas S. Sherryll BurgerShah, MD, <Dictator> Onnie BoerKrichna F. Carlynn PurlSowles, MD  Patricia PesaVIPUL S Cinderella Christoffersen MD ELECTRONICALLY SIGNED 02/27/2013 13:24

## 2014-09-10 NOTE — Consult Note (Signed)
PATIENT NAME:  Yolanda Price, Yolanda Price MR#:  [id number] DATE OF BIRTH:  07/16/1945  DATE OF CONSULTATION:  04/08/2013  CONSULTING PHYSICIAN:  Audery AmelJohn T. Clapacs, MD  IDENTIFYING INFORMATION AND REASON FOR CONSULTATION: A 69 year old woman with a history of multiple medical problems. Consultation because of recent delirium.   HISTORY OF PRESENT ILLNESS: Information obtained from the patient and from the chart and speaking with the doctor on the case as well as the nurse as well as the patient's daughter. The consensus history is that several days ago, probably last Thursday, the patient started to have paranoid delusions and possible hallucinations. She called her daughter up saying that there were people who were sneaking around outside the house who were trying to get her. The patient tells me today in a tone that implies that she still believes this, that people were trying to capture her family. She also tells me she saw a news story on the television about her great-grandson using tobacco, and this got her upset. It sounds like she had paranoia and at times may have seen things such as one report she gives of seeing somebody sticking a wire underneath her door or calling her daughter up saying that there were people sneaking into her room in the evening. These episodes sound like they were probably a little worse in the evening time. There is no report of any suicidality, homicidality or threats or dangerous behavior associated with it.   The patient's daughter made the connection that the patient's Lexapro dose had been greatly reduced just last Tuesday. They keep describing it as being stopped cold [place], although from the orders it looks like it was just decreased from 20 mg down to 5 mg. In any case, the dose was cut back abruptly because of concern that it would interact with her antibiotics. They date the onset of the delirium from shortly after that.   PAST PSYCHIATRIC HISTORY: She takes Lexapro and  Abilify, apparently for "nerves". Been prescribed by her primary care doctor. No described history of psychotic symptoms or any diagnosis of a psychotic disorder. No previous hospitalizations. No history of suicidal behavior or aggression or violence.   PAST MEDICAL HISTORY: Multiple significant ongoing medical problems. She has COPD and uses oxygen constantly. Additionally, it looks like she has had a new work-up for a nodule in her lung which is most likely a cancer. She has recent hyponatremia which, according to her, was the reason for being sent into the hospital. She has a history of MRSA pneumonia, melanoma, hypertension, has a pacemaker placed, history of deep vein thrombosis.   FAMILY HISTORY: Daughter has some anxiety issues as well.   SUBSTANCE ABUSE HISTORY: No history of any substance abuse problems on the patient's part.   SOCIAL HISTORY: She has a large extended family from what I can hear, and it sounds like she gets a lot of support from them. At this time, the plan appears to be to discharge her back to [facility], at least for the time being, and the patient is agreeable to that.   DISCHARGE MEDICATIONS: Protonix 40 mg in the evening, aspirin 81 mg a day, Percocet 7.5 mg strength q.6 hours, digoxin 0.125 mg per day, Klonopin 2 mg at night as needed, atorvastatin 40 mg a day, Advair 350 mg strength 1 puff b.i.d., Spiriva 1 capsule per day, iron sulfate 325 mg twice a day, Demodex 40 mg a day, guaifenesin 600 mg twice a day, magnesium 400 mg twice a  day, potassium 20 mEq twice a day, omega-3 fatty acids once a day, Wellbutrin XL 300 mg a day, Synthroid 88 mcg per day, Lexapro 20 mg a day, Abilify 10 mg at night, amitriptyline 100 mg at night.   LABORATORY RESULTS: Labs were reviewed. White count was recently 11.5, not too high. It looks like the chemistries are under control. She did have a positive urine culture that came back just yesterday, of gram-negative rods. When she was  admitted she had a urinalysis strongly suggestive of urinary tract infection.   A head CT shows no acute abnormality. No change from previously mild atrophy.   ASSESSMENT: This 69 year old woman who had a new onset of delirium, fairly typical symptoms, typical of sundowning sort of thing. No associated dangerous behavior. She has gotten better apparently but still holds on to some of the delusional beliefs. Her insight is partial.   There seems to be a consensus that this all has something to do with the change in dose of Lexapro. Maybe. There also multiple other things going on here that could cause delirium including an acute urinary tract infection, her use of narcotic pain medicines, and the combination of multiple other medications that she is taking. Also chronic low oxygen.   In an older person who is chronically ill and largely bedbound, the incidence of delirium from even minor stresses is very high. In this particular case, there does not seem to be any dangerousness so far associated with it. Family is aware of the nature of the problem.   The patient is going to a facility where there will be staff to monitor her. I do not think there needs to be any further hospitalization. She has already been discharged from the hospital and clearly there is a strong push to get her discharged.   I have counseled the patient and her daughter that if these things persist, and especially if they worsen, that the staff at [facility] will probably pick it up, but that they should talk to the doctor about other possible causes of the delirium as it would need a further medical work-up or evaluation.   I do not have any reason to add any medication to her current profile.   DIAGNOSIS, PRINCIPAL AND PRIMARY:  AXIS I: Delirium due to medical condition.   SECONDARY DIAGNOSES:  AXIS I: No further.  AXIS II: No diagnosis.  AXIS III: Multiple medical problems, urinary tract infection, recent pneumonia,  chronic obstructive pulmonary disease, possible lung cancer.  AXIS IV: Moderate from her burden of illness.  AXIS V: Functioning at time of evaluation, 52.   ____________________________ Audery Amel, MD jtc:np D: 04/08/2013 17:54:26 ET T: 04/08/2013 18:51:55 ET JOB#: [id number]  cc: Audery Amel, MD, <Dictator> Audery Amel MD ELECTRONICALLY SIGNED 04/08/2013 19:22

## 2014-09-10 NOTE — Discharge Summary (Signed)
PATIENT NAME:  Yolanda Price, Yolanda Price MR#:  [id number] DATE OF BIRTH:  1945-10-22  DATE OF ADMISSION:  10/12/2012 DATE OF DISCHARGE:  10/15/2012  For a detailed note, please take a look at the history and physical done on admission by Dr. Jacques Navy.   DIAGNOSES AT DISCHARGE:  Is as follows:  1.  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  2.  Chronic obstructive pulmonary disease exacerbation.  3.  Chronic hyponatremia.  4.  History of cardiomyopathy, status post automatic implantable cardiac defibrillator. 5.  Hypothyroidism. 6.  Depression.   DIET:  The patient is being discharged on a low sodium, low fat diet.   ACTIVITY:  As tolerated.   FOLLOW-UP:  With Dr. Alba Cory in the next 1 to 2 weeks.  The patient is also being referred to outpatient nephrology for her chronic hyponatremia, to Dr. Wynelle Link.   DISCHARGE MEDICATIONS:  Amitriptyline 100 mg at bedtime, Abilify 10 mg at bedtime, Klonopin 1 mg twice daily, Wellbutrin 300 mg daily, aspirin 81 mg daily, Crestor 10 mg daily, digoxin 125 mcg daily, Lasix 20 mg 1/2 tab twice daily on Tuesdays and Fridays as needed for swelling.  Lexapro 20 mg daily, lisinopril 2.5 mg daily, Lovaza 1000 mg two caps twice daily, omeprazole 20 mg twice daily, Os-Cal with vitamin D 1 tab twice daily, Spiriva 1 puff daily, Synthroid 88 mcg 1/2 tab daily, Fosamax 70 mg weekly, Coreg 3.125 mg twice daily, albuterol inhaler 2 puffs 4 times daily as needed, Symbicort 2 puffs twice daily, potassium 20 mEq daily, prednisone taper starting at 50 mg, down to 10 mg over the next five days.  Zithromax 500 mg 1/2 tab daily x 3 days and Tylenol with hydrocodone 5/325 1 tab q. 6 hours as needed for pain.   PERTINENT STUDIES DONE DURING THE HOSPITAL COURSE:  Are as follows:  CT scan of the chest done with contrast on admission showing no pulmonary embolism.  A few small scattered foci of ground-glass opacity in the right upper lobe.  Small hyper-enhancing nodule  in the spleen is indeterminate.  Sclerosis of the vertebral body and upper thoracic spine is nonspecific.   An ultrasound of the abdomen done, limited, showing fatty infiltration of the liver, prior cholecystectomy, mild prominence of the common bile duct.   HOSPITAL COURSE:  This is a 69 year old female with medical problems as mentioned above, presented to the hospital with hemoptysis and shortness of breath.  1.  Hemoptysis.  The most likely cause of patient's hemoptysis was an epiglottitis mass which was biopsied as an outpatient on bronchoscopy done by Dr. Freda Munro.  The patient had some hemoptysis shortly after which then resolved.  The patient was seen by Dr. Willeen Cass from ENT who performed a laryngoscopy.  He did not see any evidence of acute bleeding and patient did not have any further hemoptysis while in the hospital and her hemoglobin remained stable.  For now, patient will continue follow-up with ENT as an outpatient.  2.  Chronic obstructive pulmonary disease exacerbation.  This was likely the cause of patient's shortness of breath.  This was likely secondary to acute bronchitis.  She was treated with IV steroids with some Zithromax and maintenance of her inhalers, Symbicort and Spiriva.  Presently, she is being discharged on a prednisone taper and Zithromax for a few more days as stated.  She will continue follow-up with Dr. Freda Munro as an outpatient.  3.  History of cardiomyopathy, status post AICD.  The  patient did not have any evidence of congestive heart failure in the hospital.  She will continue her Coreg, her ACE inhibitor and digoxin and her Lasix as an outpatient.  4.  Abnormal LFTs.  The exact etiology of this is unclear, but suspected to be secondary to chronic passive congestion from her severe cardiomyopathy.  I did hold her Crestor while in the hospital, although she will resume that.  Her LFTs have improved.  Her hepatitis profile is normal.  Her right upper quadrant  ultrasound did show some fatty infiltration of the liver.  The patient has been on Crestor for about 10 years or so.  Unlikely it is related to that.  Her LFTs can be further followed as an outpatient by her primary care physician.  5.  Anxiety/depression.  The patient was maintained on her Klonopin, Wellbutrin and Abilify.  She will resume that.  6.  Hypothyroidism.  The patient was maintained on her Synthroid.  She will resume that.  7.  Hyponatremia.  The exact etiology of the hyponatremia is unclear presently, but likely multifactorial.  She has multiple risk factors for having hyponatremia including chronic lung disease from her chronic obstructive pulmonary disease, although unlikely this is SIADH.  Questionable this is related to her congestive heart failure versus volume depletion.  Her uric acid level in the hospital was normal.  Her urine sodium was 11 despite being on diuretics.  Clinically she was not symptomatic with her hyponatremia and she did not appear to be in congestive heart failure.  Her sodium has been chronically low for a while.  I did refer her to nephrology as an outpatient for management of her chronic hyponatremia.   The patient is a DO NOT INTUBATE/DO NOT RESUSCITATE.   DISPOSITION:  She is being discharged home.   TIME SPENT:  45 minutes.    ____________________________ Rolly PancakeVivek J. Cherlynn KaiserSainani, MD vjs:ea D: 10/15/2012 16:56:19 ET T: 10/16/2012 03:41:03 ET JOB#: [id number]  cc: Rolly PancakeVivek J. Cherlynn KaiserSainani, MD, <Dictator> Laverda SorensonSarath C. Kolluru, MD   Houston SirenVIVEK J Natanael Saladin MD ELECTRONICALLY SIGNED 10/26/2012 20:31

## 2014-09-10 NOTE — Op Note (Signed)
PATIENT NAME:  [name], Yolanda Price DATE OF BIRTH:  03/23/1946  DATE OF PROCEDURE:  02/27/2013  PREOPERATIVE DIAGNOSES: Pneumonia, chronic obstructive pulmonary disease, sepsis, and poor IV access.   POSTOPERATIVE DIAGNOSES: Pneumonia, chronic obstructive pulmonary disease, sepsis, and poor IV access.   PROCEDURES: Insertion of right internal jugular triple-lumen catheter with ultrasound guidance for central venous access.   SURGEON: Festus BarrenJason Dew, MD. ane ASSISTANT: Rico Junkerhelsea Tationa Stech, PA-C.   ANESTHESIA: Lidocaine 1%.  ESTIMATED BLOOD LOSS: Minimal.   INDICATION FOR THE PROCEDURE: The patient is admitted with multiple medical issues requiring IV medication including pneumonia, COPD, and sepsis. She has poor IV access and therefore needs central venous access for medication administration.   DESCRIPTION OF PROCEDURE: The patient is positioned supine. The right neck is prepped and draped in a sterile fashion. Ultrasound is placed in a sterile sleeve. Jugular vein is identified. It is echolucent and compressible, indicating patency. Imaging is reported for the permanent record.   Under direct visualization, the jugular vein is accessed. A J-wire is advanced without difficulty. Counterincision is made. Dilator is passed over the wire and the triple lumen catheter is advanced without problem. All 3 lumens aspirate and flush easily. The catheter was secured to the skin with 2-0 silk and sterile dressing. Chest x-ray is obtained which demonstrates good position of the central line with its tip at the superior vena cava.   ____________________________ Hoyle Sauerhelsea N. Nakira Litzau, PA-C cnh:np D: 02/27/2013 18:16:58 ET T: 02/27/2013 20:03:37 ET JOB#: [id number]  cc: Hoyle Sauerhelsea N. Drae Mitzel, PA-C, <Dictator> Jazariah Teall N Myson Levi PA ELECTRONICALLY SIGNED 03/02/2013 15:35

## 2014-09-10 NOTE — Consult Note (Signed)
Brief Consult Note: Diagnosis: Resp Failure/USA/CP/SSS.   Patient was seen by consultant.   Consult note dictated.   Recommend further assessment or treatment.   Discussed with Attending MD.   Comments: SOB Pneumonia Resp Failure Chest Pain HTN SSS Hx DVT . PLAN ROMI DVT prophylasix ASA dailyF/U enzymes 02 Sully Pacer interrogation INhalers for SOB Consider ECHO.  Electronic Signatures: Dorothyann Pengallwood, Oluchi Pucci D (MD)  (Signed 23-Jun-14 23:16)  Authored: Brief Consult Note   Last Updated: 23-Jun-14 23:16 by Dorothyann Pengallwood, Ritvik Mczeal D (MD)

## 2014-09-10 NOTE — Consult Note (Signed)
CHIEF COMPLAINT and HISTORY:  Subjective/Chief Complaint Consulted for central line   History of Present Illness 69 year old white female admitted with sepsis, CHF, respiratory failure, COPD, pneumonia. Transferred out of critical care today. She has poor IV access and needs a central line to administer her IV medications.   PAST MEDICAL/SURGICAL HISTORY:  Past Medical History:   Melanoma: 2013   Pneumonia:    Pacemaker:    interstitial cystitis:    Fatty Liver:    Emphysema:    Hypertension:    DVT:    home O2 at night:    CHF:    breast lumpectomy x 2 right breast:    melanoma from right upper arm:    ICD: Jun 2006   Parotid Tumor removal:    Appendectomy:    Cholecystectomy:    Hysterectomy - Partial:    COPD:   ALLERGIES:  Allergies:  Codeine: Other  HOME MEDICATIONS:  Home Medications: Medication Instructions Status  Cipro 750 mg oral tablet 1 tab(s) orally every 12 hours Active  docusate sodium 100 mg oral capsule 2 cap(s) orally 2 times a day Active  Feosol 325 mg (65 mg elemental iron) oral tablet 1 tab(s) orally 2 times a day (with meals) Active  Diflucan 200 mg oral tablet 1 tab(s) orally once a day Active  Advair Diskus 250 mcg-50 mcg inhalation powder 1 puff(s) inhaled 2 times a day Active  Klor-Con M20 oral tablet, extended release 1 tab(s) orally once a day Active  DuoNeb 0.5 mg-2.5 mg/3 mL inhalation solution 3 milliliter(s) inhaled 4 times a day, As Needed Active  carvedilol 3.125 mg oral tablet 1 tab(s) orally 2 times a day Active  clonazePAM 2 mg oral tablet 1 tab(s) orally 2 times a day Active  guaiFENesin 600 mg oral tablet, extended release 1 tab(s) orally every 12 hours Active  Synthroid 88 mcg (0.088 mg) oral tablet 0.5 tab(s) orally once a day (in the morning) Active  digoxin 125 mcg (0.125 mg) oral tablet 1 tab(s) orally once a day (in the morning) Active  Lexapro 20 mg oral tablet 1 tab(s) orally once a day (in the morning)  Active  Lovaza ethyl esters 1000 mg oral capsule 2 cap(s) orally 2 times a day Active  amitriptyline 100 mg oral tablet 1 tab(s) orally once a day (in the evening) Active  Spiriva 18 mcg inhalation capsule 1 cap(s) via handihaler once a day. Active  buPROPion 300 mg/24 hours (XL) oral tablet, extended release 1 tab(s) orally once a day (in the morning) Active  Linzess 290 mcg oral capsule 1 cap(s) orally once a day (in the evening) Active  aspirin 81 mg oral delayed release tablet 1 tab(s) orally 2 times a day Start after 1 week Active  Abilify 10 mg oral tablet 1 tab(s) orally once a day Active  pantoprazole 40 mg oral delayed release tablet 1 tab(s) orally 2 times a day Active  ProAir HFA CFC free 90 mcg/inh inhalation aerosol 1 puff(s) inhaled every 4 to 6 hours, As Needed - for Wheezing, for Shortness of Breath  Active  acetaminophen-oxyCODONE 325 mg-7.5 mg oral tablet 1 tab(s) orally every 4 to 6 hours, As Needed Active  torsemide 20 mg oral tablet 2 tab(s) orally once a day Active  Bactrim 400 mg-80 mg oral tablet 1 tab(s) orally once a day Active  atorvastatin 40 mg oral tablet 1 tab(s) orally once a day Active   Family and Social History:  Family History Coronary Artery  Disease  breast and bone cancer   Review of Systems:  Fever/Chills No   Cough Yes   Abdominal Pain No   Nausea/Vomiting No   SOB/DOE Yes   Physical Exam:  NECK supple  No masses   RESP decreased bases   CARD regular rate   ABD denies tenderness  normal BS   LYMPH negative neck   SKIN normal to palpation, dry   PSYCH alert   LABS:  Laboratory Results: LabObservation:    10-Oct-14 08:37, Echo Doppler  OBSERVATION   Reason for Test  Hepatic:    10-Oct-14 04:23, Comprehensive Metabolic Panel  Bilirubin, Total 0.8  Alkaline Phosphatase 183  SGPT (ALT) 40  SGOT (AST) 68  Total Protein, Serum 5.8  Albumin, Serum 2.5  Cardiology:    10-Oct-14 08:37, Echo Doppler  Echo Doppler   REASON  FOR EXAM:      COMMENTS:       PROCEDURE: Branch - ECHO DOPPLER COMPLETE(TRANSTHOR)  - Feb 27 2013  8:37AM     RESULT: Echocardiogram Report    Patient Name:   Yolanda Price Date of Exam: 02/27/2013  Medical Rec #:  [id number]             Custom1:  Date of Birth:  1945-09-20           Height:       67.0 in  Patient Age:    71 years           Weight:       199.0 lb  Patient Gender: F                  BSA:          2.02 m??    Indications: CHF  Sonographer:    Sherrie Sport RDCS  Referring Phys: Max Sane, S    Sonographer Comments: Technically challenging study due to less than   ideal echo windows and COPD.    Summary:   1. Left ventricular ejection fraction, by visual estimation, is 55 to   60%.   2. Mildly dilated left atrium.   3. Mildly elevated pulmonary artery systolic pressure.   4. Mild tricuspid regurgitation.  2D AND M-MODE MEASUREMENTS (normal ranges within parentheses):  Left Ventricle:          Normal  IVSd (2D):      1.10 cm (0.7-1.1)  LVPWd (2D):     1.05 cm (0.7-1.1) Aorta/LA:                  Normal  LVIDd (2D):     4.51 cm (3.4-5.7) Aortic Root (2D): 3.00 cm (2.4-3.7)  LVIDs (2D):     3.21 cm           Left Atrium (2D): 4.10 cm (1.9-4.0)  LV FS (2D):     28.8 %   (>25%)  LV EF (2D):     55.6 %   (>50%)                       Right Ventricle:                                    RVd (2D):        0.16 cm  LV DIASTOLIC FUNCTION:  MV Peak E: 0.93 m/s E/e' Ratio: 10.90  MV Peak A: 1.07 m/s Decel Time: 246  msec  E/A Ratio: 0.87  SPECTRAL DOPPLER ANALYSIS (where applicable):  Mitral Valve:  MV P1/2 Time: 71.34 msec  MV Area, PHT: 3.08 cm??  Aortic Valve: AoV Max Vel: 1.21 m/s AoV Peak PG: 5.9 mmHg AoV Mean PG:  LVOT Vmax: 0.91 m/s LVOT VTI:  LVOT Diameter: 2.10 cm  AoV Area, Vmax: 2.60 cm?? AoV Area, VTI:  AoV Area, Vmn:  Tricuspid Valve and PA/RV Systolic Pressure: TR Max Velocity: 2.80 m/s RA   Pressure: 5 mmHg RVSP/PASP: 36.5 mmHg  Pulmonic Valve:  PV Max  Velocity: 0.90 m/s PV Max PG: 3.3 mmHg PV Mean PG:    PHYSICIAN INTERPRETATION:  Left Ventricle:The left ventricular internal cavity size was normal. LV   posterior wall thickness was normal. Left ventricular ejection fraction,   by visual estimation, is 55 to 60%.  Left Atrium: The left atrium is mildly dilated.  Mitral Valve: The mitral valveis not well seen. Trace mitral valve   regurgitation is seen.  Tricuspid Valve: The tricuspid valve is not well seen. Mild tricuspid     regurgitation is visualized. The tricuspid regurgitant velocity is 2.80   m/s, and with an assumed right atrial pressure of 5 mmHg, the estimated   right ventricular systolic pressure is mildly elevated at 36.5 mmHg.  Aortic Valve: The aortic valve was not well seen.  Additional Comments: A pacer wire is visualized.    Screven MD  Electronically signedby [place] Bartholome Bill MD  Signature Date/Time: 02/27/2013/2:27:16 PM    *** Final ***    IMPRESSION: .      Verified By: Teodoro Spray, M.D., MD  Routine Chem:    10-Oct-14 04:23, Comprehensive Metabolic Panel  Glucose, Serum 87  BUN 13  Creatinine (comp) 0.85  Sodium, Serum 137  Potassium, Serum 2.6  Chloride, Serum 100  CO2, Serum 31  Calcium (Total), Serum 7.8  Osmolality (calc) 273  eGFR (African American) >60  eGFR (Non-African American) >60  eGFR values <26m/min/1.73 m2 may be an indication of chronic  kidney disease (CKD).  Calculated eGFR is useful in patients with stable renal function.  The eGFR calculation will not be reliable in acutely ill patients  when serum creatinine is changing rapidly. It is not useful in   patients on dialysis. The eGFR calculation may not be applicable  to patients at the low and high extremes of body sizes, pregnant  women, and vegetarians.  Anion Gap 6    10-Oct-14 04:23, Lipid Profile ([facility])  Cholesterol, Serum 108  Triglycerides, Serum 52  HDL (INHOUSE) 73  VLDL Cholesterol Calculated 10   LDL Cholesterol Calculated 25  Result(s) reported on 27 Feb 2013 at 05:49AM.    10-Oct-14 04:23, Magnesium, Serum  Magnesium, Serum 0.8  1.8-2.4  THERAPEUTIC RANGE: 4-7 mg/dL  TOXIC: > 10 mg/dL   -----------------------    10-Oct-14 04:50, Phosphorus, Serum  Phosphorus, Serum 3.4  Result(s) reported on 27 Feb 2013 at 11:35AM.  Cardiac:    10-Oct-14 04:50, Troponin I  Troponin I < 0.02  0.00-0.05  0.05 ng/mL or less: NEGATIVE   Repeat testing in 3-6 hrs   if clinically indicated.  >0.05 ng/mL: POTENTIAL   MYOCARDIAL INJURY. Repeat   testing in 3-6 hrs if   clinically indicated.  NOTE: An increase or decrease   of 30% or more on serial   testing suggests a   clinically important change  Routine Coag:    10-Oct-14 04:23, Prothrombin Time  Prothrombin 15.1  INR 1.2  INR reference interval applies to patients on anticoagulant therapy.  A single INR therapeutic range for coumarins is not optimal for all  indications; however, the suggested range for most indications is  2.0 - 3.0.  Exceptions to the INR Reference Range may include: Prosthetic heart  valves, acute myocardial infarction, prevention of myocardial  infarction, and combinations of aspirin and anticoagulant. The need  for a higher or lower target INR must be assessed individually.  Reference: The Pharmacology and Management of the Vitamin K   antagonists: the seventh ACCP Conference on Antithrombotic and  Thrombolytic Therapy. LVDIX.1855 Sept:126 (3suppl): [id number].  A HCT value >55% may artifactually increase the PT.  In one study,   the increase was an average of 25%.  Reference:  "Effect on Routine and Special Coagulation Testing Values  of Citrate Anticoagulant Adjustment in Patients with High HCT Values."  American Journal of Clinical Pathology 2006;126:400-405.  Routine Hem:    10-Oct-14 04:23, CBC Profile  WBC (CBC) 8.0  RBC (CBC) 3.05  Hemoglobin (CBC) 8.5  Hematocrit (CBC) 25.7  Platelet Count  (CBC) 201  MCV 84  MCH 27.8  MCHC 33.0  RDW 21.5  Neutrophil % 78.8  Lymphocyte % 11.1  Monocyte % 8.5  Eosinophil % 1.4  Basophil % 0.2  Neutrophil # 6.3  Lymphocyte # 0.9  Monocyte # 0.7  Eosinophil # 0.1  Basophil # 0.0  Result(s) reported on 27 Feb 2013 at 05:12AM.   ASSESSMENT AND PLAN:  Assessment/Admission Diagnosis 69 year old white female admitted with sepsis, CHF, respiratory failure, COPD, pneumonia. Transferred out of critical care today. She has poor IV access and needs a central line to administer her IV medications.  Right jugular central line has been placed at bedside. CXR shows tip at SVC, no pneumothroax.   Electronic Signatures: Su Grand (PA-C)  (Signed 10-Oct-14 18:34)  Authored: Chief Complaint and History, PAST MEDICAL/SURGICAL HISTORY, ALLERGIES, HOME MEDICATIONS, Family and Social History, Review of Systems, Physical Exam, LABS, Assessment and Plan   Last Updated: 10-Oct-14 18:34 by Su Grand (PA-C)

## 2014-09-10 NOTE — H&P (Signed)
PATIENT NAME:  Yolanda Price, Yolanda Price MR#:  [id number] DATE OF BIRTH:  08-06-1945  DATE OF ADMISSION:  02/08/2013  PRIMARY CARE PHYSICIAN: Dr Carlynn PurlSowles  PULMONOLOGIST: Dr. Freda MunroSaadat Khan  CARDIOLOGIST:  Dr. Darrold JunkerParaschos   CHIEF COMPLAINT:  Syncope, with fall today.   HISTORY OF PRESENT ILLNESS:  Yolanda Price is a 69 year old Caucasian female who had recent multiple admissions to the hospital once a month since May 2014. Was recently admitted August 7, discharged August 13 with complaint when she was discharged with Pseudomonas Carbapenem-resistant pneumonia. She completed first course of Cipro 750 b.i.d. for 21 days. Continued to have cough. Is currently on her second round of Cipro 750 b.i.d., which was prescribed by Dr. Freda MunroSaadat Khan. She is also currently getting prednisone taper for her chronic COPD. She comes in today after she had a syncopal episode while walking in the bathroom with assistance, with her daughter by her side. She passed out for a few seconds, per daughter, came around. Denied any injury. She came to the Emergency Room, and was found to have a sodium of 117. She has low potassium, appears clinically dehydrated as well.   She was recently started on metolazone by Cardiology as outpatient, and her Lasix was increased due to her shortness of breath from presumed CHF/COPD. The patient currently has some lingering cough. She did have a low-grade fever in the morning, per daughter. However, she is afebrile at this time. She is being admitted for acute hyponatremia, likely medication-related, likely due to diuretics, poor p.o. intake, and vomiting for the last 2 days.   PAST MEDICAL HISTORY: 1. Ongoing treatment for pseudomonas aeruginosa, carbapenem-resistant Enterobacter  pneumonia. The patient is currently on a second round of Cipro 750 b.i.d. for 21 more days.   2.  COPD.   3.  Chronic respiratory failure, oxygen dependent, on 2 liters nasal cannula oxygen.  4.  Chronic T4 fracture.  5.  Chronic  constipation.  6.  History of AFib, chronic, not on anticoagulation.  7.  Congestive heart failure, chronic.  8.  AICD placement.  9.  History of CVA.  10.  Hyperlipidemia.  11.  Anxiety/depression.  12.  Hypothyroidism.  13.  Chronic pain syndrome, on chronic narcotics.    ALLERGIES:  CODEINE.  MEDICATIONS: 1.  Furosemide 10 mg daily.  2.  Lovaza 2 tablets twice a day.  3.  Lexapro 20 mg daily.  4. Cipro 750 b.i.d., patient currently is on her second round of 21-day treatment, which was recently started.  5.  Crestor 10 mg daily.  6.  Carvedilol 6.25 mg b.i.d.  7.  Symbicort 1 puff b.i.d.  8.  ProAir HFA 2 puffs every 4 hours to 6 hours as needed.  9.  Lisinopril 2.5 mg daily.  10.  Digoxin 0.125 mg daily.  11.  Clonazepam 2 mg twice a day.  12.  Aspirin 81 mg daily.  13.  Amitriptyline 100 mg in the evening.  14.  Protonix 40 mg daily.  15.  Spiriva Handi-Haler 1 puff daily.  16.  Abilify 10 mg at bedtime.  17.  Bupropion XL 300 mg daily.  18.  Synthroid 44 mg daily.   19.  Bactrim 80/400, 1 tablet daily for chronic UTI suppression.  20.  Oxygen daily.  21.  Linzess 290 mcg p.o. daily for constipation.  22.  DuoNeb.  23.  Plavix 75 mg daily.  24.  Metolazone 5 mg daily, this was recently started by Dr. Darrold JunkerParaschos.  25.  Prednisone  10 mg taper.  26.  Benzonatate 100 mg 3 times a day.  27.  Oxycodone 7.5/325, 1 tablet 3 times a day.   CODE STATUS:  NO CODE, DNR. This was verified and confirmed with the patient.   PAST SURGICAL HISTORY:  1.  Lesion on epiglottis taken off recently.  2.  AICD placement.  3.  Appendectomy.  4.  Cholecystectomy.  5.  Benign breast tumor.  6.  Partial hysterectomy.   SOCIAL HISTORY: She quit in March 2014. Denies alcohol or any other drug use. She lives with her daughter, Bonita Quin.   FAMILY HISTORY: From old records, father died of MI, mother died of MI. Sister had bone cancer. There is family history of breast cancer.   REVIEW OF  SYSTEMS: CONSTITUTIONAL: No fever. Positive fatigue, weakness.  EYES:  No blurred or double vision, cataracts or glaucoma.  ENT:  No tinnitus, ear pain, hearing loss.  RESPIRATORY: Positive for cough, shortness of breath and COPD. CARDIOVASCULAR: No chest pain, orthopnea, edema.  GASTROINTESTINAL: Positive for nausea and vomiting. No abdominal pain, hematemesis. Positive for heme-positive stools.  GENITOURINARY:  No dysuria, hematuria or frequency.  ENDOCRINE:  No polyuria, nocturia or thyroid problems.  HEMATOLOGY:  No anemia or easy bruising.  SKIN:  No acne, rash or any lesions.  MUSCULOSKELETAL:  Positive for arthritis. No swelling or gout.  NEUROLOGIC: No CVA, TIA, or weakness.  PSYCHIATRIC:  Positive for anxiety and depression.  All other systems reviewed are negative.   PHYSICAL EXAMINATION: GENERAL:  Morbidly obese. Not in acute distress.  VITAL SIGNS:  Afebrile, pulse is 92, blood pressure is 92/50, sats are 94% on 2 liters.  HEENT: Atraumatic, normocephalic. Pupils PERLA. EOM intact. Oral mucosa is dry.  NECK:  Supple. No JVD. No carotid bruit.  RESPIRATORY:  Decreased breath sounds in the bases. Coarse breath sounds heard. No use of accessory muscles. No crackles heard.  CARDIOVASCULAR: Both the heart sounds are normal. Rhythm regular. PMI not lateralized. Chest nontender.  EXTREMITIES: Feeble pedal pulses, good femoral pulses. There is trace lower extremity edema. The patient has a lot of ecchymosis secondary to IV sticks during past hospitalization. NEUROLOGIC:  Grossly intact cranial nerves II through XII. Generalized weakness present. No focal weakness. Gait deferred.  SKIN:  Warm and dry.   LABORATORY DATA:  UA negative for UTI. Chest x-ray: Hazy left lower lobe airspace disease concerning for pneumonia. CT of the head: No acute intracranial abnormality. White count is 12.9, H and H is 9 and 27.1, platelet count is 479. Glucose is 312, BUN is 18, creatinine is 1.31, sodium  is 117, potassium is 3.0, chloride is 71, bicarb is 34, calcium is 9. Troponin is 0.02. Digoxin is 1.02. B-type natriuretic peptide is 356.  The patient's previous creatinine is 0.55.   ASSESSMENT: Yolanda Price is a 69 year old with multiple medical problems, including chronic respiratory failure with ongoing treatment for Pseudomonas pneumonia, comes in with:  1. Syncopal episode, which likely is due to dehydration, acute on chronic renal failure, and acute hyponatremia. Recently her Lasix was increased and metolazone was added for shortness of breath for presumed congestive heart failure by Cardiology. She has been vomiting for the last couple of days, and likely has precipitated her hyponatremia and dehydration. Will admit patient to medical floor, start her on IV fluids, replace her electrolytes with potassium, monitor creatinine and sodium closely. Will hold off on blood pressure medications at this time along with lisinopril. Hold off on diuretics, which  is metolazone.   2.  Ongoing treatment for Pseudomonas pneumonia. The patient is on her second round of Cipro 750 b.i.d. for 21 more days. She finished her first round for 21 days the first 3 weeks of August. The patient follows up with Dr. Freda Munro as outpatient.   3.  Chronic respiratory failure secondary to chronic obstructive pulmonary disease, on home oxygen. The patient is currently on prednisone taper as an outpatient, which will continue. Will also continue oral inhalers and nebulizer treatment.   4.  History of hypertension with relative hypotension. I will hold off on blood pressure medications at this time. Will resume when BP is more stable.   5.  Anemia of chronic disease, with heme-positive stools. Per daughter, patient had endoscopy as outpatient. It was done several months ago by Dr. Bluford Kaufmann. For some reason I could not get to the records. She is already on Protonix. She probably had some gastritis. Her hemoglobin is down from  11.0 to 9.0. I will continue the Protonix. Will continue monitoring hemoglobin. For now, I will continue aspirin and Plavix, but if the hemoglobin continues to drift down, then hold off those medications and consider a GI consult. I discussed with daughter, since she is currently undergoing treatment for her recurrent pneumonia and her dehydration and hyponatremia, will hold off on further GI workup at this time unless patient continues to show signs of active GI bleeding.   6.  Hypothyroidism. On Synthroid.   7.  Depression/anxiety. Continue Lexapro, bupropion, clonazepam, and Abilify.   8.  Deep vein thrombosis prophylaxis. TEDs and SCDs.   9.  Code status was addressed with patient and daughter. The patient is a NO CODE, DNR.  TIME SPENT:  50 minutes.    ____________________________ Wylie Hail Allena Katz, MD sap:mr D: 02/08/2013 20:28:01 ET T: 02/08/2013 20:54:20 ET JOB#: [id number]  cc: Jacoby Ritsema A. Allena Katz, MD, <Dictator> Yevonne Pax, MD Marcina Millard, MD   Willow Ora MD ELECTRONICALLY SIGNED 02/09/2013 13:15

## 2014-09-10 NOTE — H&P (Signed)
PATIENT NAME:  Yolanda Price, Yolanda Price MR#:  [id number] DATE OF BIRTH:  07/19/1945  DATE OF ADMISSION:  04/06/2013  PRIMARY CARE PHYSICIAN: Dr. Carlynn Purl, but presently is under care of Dr. Elease Hashimoto at the nursing home.   CHIEF COMPLAINT: Hallucinations and hyponatremia.   HISTORY OF PRESENTING ILLNESS: A 69 year old Caucasian female patient with history of chronic obstructive pulmonary disease, chronic respiratory failure on 3 liters oxygen, pseudomonas and MRSA pneumonia, atrial fibrillation not on anticoagulation, diastolic congestive heart failure, coronary artery disease and esophagitis presents to the hospital with complaints of hallucinations. The patient has also been diagnosed with the hyponatremia. She has been on treatment with Zyvox over the past week for seven days for VRE urinary tract infection. The patient's Lexapro was stopped and Zyvox was started due to the interaction. Daughter mentions that since then, she has noted some hallucinations in the patient.  Today,  her sodium is 124 and has been found to have a left lung 12 mm spiculated mass concerning for bronchogenic carcinoma with multiple comorbidities, hallucinations, urinary tract infection, hyponatremia: The patient is being admitted as inpatient for further work-up and treatment. The patient presently is alert, oriented but seems to have on and off hallucinations regarding people standing outside.   The patient has had multiple admissions with this being her seventh admission in the past six months with mostly chronic obstructive pulmonary, MRS and pseudomonas pneumonia. The patient has also had recurrent UTIs and progressive decline over the past few months. She is presently DNR/DNI.   PAST MEDICAL HISTORY:  1.  Pseudomonas and MRSA pneumonia.  2.  Chronic obstructive pulmonary disease.  3.  Chronic respiratory failure on 2 to 3 liters of oxygen.  4.  Chronic T4 pain.  5.  Chronic constipation.  6.  Chronic atrial fibrillation not on  anticoagulation.  7.  Chronic diastolic heart failure.  8.  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  9.  CVA.  10.  Hyperlipidemia.  11.  Anxiety and depression.  12.  Hypothyroidism.  13.  Chronic pain syndrome, on narcotics.  14.  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  15.  Hypothyroidism.  16.  Hyperlipidemia.  17.  Coronary artery disease.   PAST SURGICAL HISTORY: 1.  Epiglottic lesion, laser by ENT.  2.  Automatic implantable cardiac defibrillator placement.  3.  Appendectomy.  4.  Cholecystectomy.  5.  Benign breast tumor.  6.  Partial hysterectomy.   ALLERGIES: CODEINE.   SOCIAL HISTORY: The patient is a former smoker of 53 years. Quit in March 2014. No alcohol. No illicit drugs. Presently is a resident of nursing home.   CODE STATUS: DNR/DNI.   FAMILY HISTORY: Father died of a myocardial infarction mother died of a myocardial infarction. Her sister has bone cancer.   REVIEW OF SYSTEMS:  CONSTITUTIONAL: Complains of fatigue and weakness.  EYES: No blurred vision, pain, redness.  EARS, NOSE, THROAT: No tinnitus, ear pain, hearing loss.  RESPIRATORY: Has chronic shortness of breath, chronic obstructive pulmonary disease, chronic respiratory failure, productive sputum.  CARDIOVASCULAR: Has on and off lower extremity edema and chronic shortness of breath.  GASTROINTESTINAL: No nausea, vomiting, diarrhea. Has poor appetite.  GENITOURINARY: No dysuria, hematuria. Has some urinary incontinence.  ENDOCRINE: No polyuria, nocturia, has hypothyroidism.  HEMATOLOGY: No anemia, easy bruising, bleeding.   INTEGUMENTARY: No acne, rash, lesions.  MUSCULOSKELETAL: Has chronic back pain, arthritis.  NEUROLOGIC: Has had some hallucinations, mental status changes. No focal weakness.  PSYCHIATRIC: Has anxiety, depression.   HOME MEDICATIONS:  Include:  1.  Abilify 10 mg daily.  2.  Acidophilus 1 tablet daily.  3.  Advair Diskus 1 puff b.i.d.  4.  Amitriptyline  100 mg daily.  5.  Aspirin 81 mg daily.  6.  Atorvastatin 40 mg daily.  7.  Bupropion 300 mg oral once a day.  8.  Clonazepam 2 mg oral 2 times a day for anxiety.  9.  Colace 100 mg 2 tablets oral 2 times a day.  10.  Coreg 3.125 mg oral 2 times a day.  11.  Digoxin 125 mcg daily.  12.  DuoNeb 3 mL inhaled every six hours as needed.  13.  Ferrous sulfate 325 mg oral 2 times a day.  14.  Guaifenesin 600 mg oral twice a day.  15.  Ketoconazole topical cream to affected area as needed.  16.  Lexapro 5 mg oral once a day, changed one week prior from 20 mg daily.  17.  Linzess 290 mcg once a day.  18.  Lovaza 1000 mg 2 capsules oral 2 times a day.  19.  Magnesium oxide 400 mg oral 2 times a day.  20.  Percocet 10/325 every six hours as needed for pain 1 tab.  21.  Potassium chloride 20 mEq oral 2 times a day.  22.  ProAir HFA 1 puff inhaled 4 to 6 times a day as needed.  23.  Protonix 40 mg daily.  24.  Senna 1 tablet oral 2 times a day.  25.  Spiriva 18 mcg inhaled daily.  26.  Synthroid 88 mcg 1/2 tablet once a day.  27.  Torsemide 20 mg 2 tablets oral 2 times a day.  28.  Zyvox 600 mg oral 2 times a day for seven days.   PHYSICAL EXAMINATION: VITAL SIGNS: Temperature 97.6, pulse of 92, blood pressure of 101/50, saturating 96% on 3 liters oxygen.   GENERAL: Obese Caucasian female patient lying in bed,  seems comfortable, conversational.  PSYCHIATRIC:  She is alert and oriented to person, place and time. Pleasant.  HEENT: Atraumatic, normocephalic. Oral mucosa moist and pink. Pallor positive. No icterus. Pupils bilaterally equal and reactive to light.  NECK: Supple. No thyromegaly or palpable lymph nodes. Trachea midline. No carotid bruit, JVD.  CARDIOVASCULAR: S1, S2, without any murmurs. Peripheral pulses 2+. No edema.  RESPIRATORY: Has bilateral coarse breath sounds with mild wheezing. No use of accessory muscles.  GASTROINTESTINAL: Soft abdomen, nontender. Bowel sounds present. No  organomegaly palpable.  SKIN: Warm and dry. No petechiae, rash, ulcers.  GENITOURINARY: No CVA tenderness or bladder distention.  MUSCULOSKELETAL: No joint swelling, redness, effusion of the large joints. Normal muscle tone.  NEUROLOGICAL: Motor strength 5/5 in upper  and lower extremities. Sensation is intact all over.   SKIN: Has diffuse bruising.  LYMPHATIC: No cervical lymphadenopathy.   LABORATORY, DIAGNOSTIC AND RADIOLOGIC DATA: Glucose of 111, BUN 7, creatinine 0.76, sodium 124, potassium 3.6, chloride 88, GFR greater than 60. WBC 11.1, hemoglobin 13.9, platelets of 296. Urinalysis shows 58 WBC and 1+ bacteria.   EKG shows paced rhythm.   Chest x-ray PA and lateral, showed chronic emphysematous and pulmonary scarring changes.   CT scan of the chest with contrast showed no pulmonary embolism.  Did show 12 mm spiculated left lower lobe nodule suggestive of bronchogenic carcinoma.   ASSESSMENT AND PLAN: 1.  Urinary tract infection with acute encephalopathy. Her encephalopathy seems to be better. We will start her on meropenem to cover broadly. The patient has been  treated for VRE recently with Zyvox for seven days. We will not repeat this medication at this time. The patient will  have her antibiotics changed as per her urine culture results.  2.  Hallucinations, acute encephalopathy could be secondary to her Lexapro being changed as her symptoms seemed to coincide with change in dosage, but presently will treat urinary tract infection and monitor. Increase the Lexapro back to 20 mg a day. The patient will be on fall precautions. If she does not get better, we will  need a psychiatric consult.  3.  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 We will consult her pulmonologist, Dr. Welton FlakesKhan. She does have a long 52 pack-year smoking history, putting her at  high risk.  We will need lung biopsy and oncology consult. We will defer this to Dr. Welton FlakesKhan.  4.  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 The patient seems euvolemic. We will get urine sodium, protein, urine osmolality. I will decrease her torsemide from twice a day to once a day, put her on a fluid restriction of 1500 ml a day and monitor her sodium. She has had lower sodium 117. I do not feel that her sodium is playing a role in her encephalopathy.  5.  Chronic obstructive pulmonary disease with chronic respiratory failure seems stable. Continue inhalers, nebulizers as needed and oxygen at 2 to 3 liters.   6.  Deep vein thrombosis prophylaxis with the heparin.   TIME SPENT TODAY ON THIS CASE: Was 65 minutes.   ____________________________ Molinda BailiffSrikar R. Evlyn Amason, MD srs:cc D: 04/06/2013 23:10:27 ET T: 04/07/2013 00:13:51 ET JOB#: [id number]  cc: Wardell HeathSrikar R. Elpidio AnisSudini, MD, <Dictator> Yevonne PaxSaadat A. Khan, MD Leo GrosserNancy J. Maloney, MD Onnie BoerKrichna F. Carlynn PurlSowles, MD Orie FishermanSRIKAR R Vicente Weidler MD ELECTRONICALLY SIGNED 04/07/2013 2:30

## 2014-09-10 NOTE — Discharge Summary (Signed)
PATIENT NAME:  Yolanda Price, Yolanda Price MR#:  [id number] DATE OF BIRTH:  [birth year]  DATE OF ADMISSION:  02/26/2013 DATE OF DISCHARGE:  03/02/2013  REASON FOR ADMISSION: Altered mental status and shortness of breath.  FINAL DIAGNOSES: 1.  Systemic inflammatory response syndrome likely secondary to pseudomonas pneumonia.  2.  Acute on chronic respiratory failure due to pneumonia, congestive heart failure and chronic obstructive pulmonary disease.  3.  Congestive heart failure, diastolic dysfunction, with mild exacerbation.  4.  Hemoptysis, resolved.  5.  Metabolic encephalopathy, altered mental status due to sepsis.  6.  Hypokalemia, resolved.  7.  Status post permanent pacemaker.  8.  History of interstitial cystitis.  9.  Fatty liver.  10.  Hypertension.  11.  History of deep vein thrombosis in the past. 12.  Chronic respiratory failure, on home oxygen.  13.  History of melanoma of the right upper extremity.  14.  Automatic implantable cardiac defibrillator placement.  DISPOSITION: Skilled nursing facility.  DISCHARGE MEDICATIONS: 1.  Digoxin 1.25 mcg once a day. 2.  Lexapro 20 mg daily. 3.  Lovaza 2 capsules twice daily.  4.  Amitriptyline 100 mg at bedtime. 5.  Spiriva 18 mcg once a day. 6.  Bupropion 300 mg/24 hours once a day. 7.  Linzess 290 mcg every evening. 8.  Aspirin 81 mg twice daily. 9.  Clonazepam 2 mg twice daily. 10.  Coreg 3.125 mg twice daily. 11.  DuoNebs 4 times a day. 12.  Klor-Con 20 mEq once a day. 13.  Guaifenesin as needed for cough.  14.  Advair Diskus 250/50 twice daily. 15.  Feosol 325 mg twice daily. 16.  Abilify 10 mg once daily. 17.  Docusate 100 mg 2 tablets twice daily. 18.  Omeprazole 40 mg once a day. 19.  ProAir HFA 90 mcg every 4 hours as needed.  20.  Torsemide 20 mg 2 tablets twice daily. 21.  Atorvastatin 40 mg once a day. 22.  Heparin subcutaneous 5000 units every 8 hours. 23.  Ketoconazole application on effected areas.  24.  Magnesium  oxide 400 mg twice daily. 25.  Levofloxacin 750 mg p.o. once a day every 24 hours. 26.  Levothyroxine 44 mcg once a day. 27.  Tylenol with hydrocodone 7.5/325 mg every 6 hours as needed for pain.   HOME OXYGEN: 2 liters nasal cannula.  DISCHARGE FOLLOWUP: With Dr. Clydie Braunavid Fitzgerald, infectious disease, in 2 to 4 weeks, and primary care physician, Dr. Cristino MartesKrishna Sowles, in 2 weeks.   HOSPITAL COURSE: This is a very nice 69 year old female who was recently hospitalized and diagnosed with pseudomonas pneumonia. She was discharged from the hospital on September 29th to [facility] and continued treatment that was supposed to last for 21 days with ciprofloxacin for treatment of pseudomonas.   The patient finally started to get worse and had a fall. The patient started to become a little bit lethargic. She was changed from furosemide to torsemide in the office of Dr. Darrold JunkerParaschos right about the same time. The patient started hurting a little bit more in the face due to the fall and started taking more Percocet. She was having metabolic encephalopathy, likely secondary to sepsis, but on top of that likely due to use of pain medication. She was evaluated in the ER where she was hypotensive with blood pressure 95/60. Her chest x-ray showed still the consolidation from a previous pneumonia, not getting significantly better, for what the patient was started on triple antibiotics including vancomycin, Zosyn and Levaquin. The  patient started improving quickly. She also was started on Lasix due to some signs of pulmonary congestion and pulmonary edema. Cardiology was consulted and Dr. Lady Gary saw the patient. At this moment, she is no longer in any acute exacerbation. That seems to be compensated.   As far as her sepsis, it was likely secondary to pseudomonas pneumonia. The patient was upgraded to ciprofloxacin to Levaquin, Zosyn and vancomycin. The patient's blood cultures were negative. She had also some hemoptysis  that was likely due to her recent surgery with laser from her epiglottis. The patient improved drastically. Her white count on admission was 13,000. Right now it is back to 3.8. Her hemoglobin is 8.5. Her electrolytes are within normal limits. Her potassium was very low during this admission, 2.6. It improved to 3.7 at discharge. Her HDL was 73. Her LDL was 25. At this moment, the patient is doing much better and she is able to go back to her facility.   As far as her chronic respiratory failure, continue oxygen at 2 liters nasal cannula.  For chronic obstructive pulmonary disease with mild exacerbation, treated with antibiotics for pneumonia, no steroids.  For metabolic encephalopathy, resolved, it was due to the sepsis.   Her other medical problems are stable.   We are going to discharge the patient on Levaquin and tobramycin nebs for coverage for pseudomonas pneumonia. The patient is to follow up with Dr. Sampson Goon outpatient.  TIME SPENT: 45 minutes with this discharge.   ____________________________ Felipa Furnace, MD rsg:sb D: 03/02/2013 11:19:03 ET T: 03/02/2013 11:59:27 ET JOB#: [id number]  cc: Felipa Furnace, MD, <Dictator> Stann Mainland. Sampson Goon, MD Onnie Boer. Carlynn Purl, MD Regan Rakers Juanda Chance MD ELECTRONICALLY SIGNED 03/02/2013 16:18

## 2014-09-10 NOTE — Consult Note (Signed)
EGD showed diffuse esophagitis. May have candidiasis. Biopsies taken. Started nystatin s/s until biopsies are back. Also, moderate GE junction stricture. Dilated to 18mm wit TTS balloon. Full liquid ordered. May have bled from esophagus. May bleed again if ASA/plavix resumed soon. Will check back on Monday. Call GI on call if there are questions. Thanks.  Electronic Signatures: Lutricia Feilh, Calieb Lichtman (MD)  (Signed on 26-Sep-14 11:26)  Authored  Last Updated: 26-Sep-14 11:26 by Lutricia Feilh, Henretta Quist (MD)

## 2014-09-10 NOTE — H&P (Signed)
PATIENT NAME:  Yolanda Price, Yolanda Price MR#:  [id number] DATE OF BIRTH:  1945-06-29  DATE OF ADMISSION:  11/26/2012  REFERRING PHYSICIAN:  Dr. Manson Passey.   PRIMARY CARE PHYSICIAN:  Dr. Alba Cory.   CHIEF COMPLAINT:  Right-sided numbness.   HISTORY OF PRESENT ILLNESS:  Yolanda Price is a 69 year old pleasant female with a past medical history of COPD, hypertension, congestive heart failure, hypothyroidism, was resting in a chair, started to experience right-sided numbness around 5:30 p.m.  Concerning this, called her daughter and also called the home health nurse who recommended the patient to come to the Emergency Department by alerting the EMS.  The patient continued to have numbness on the entire right side of the body.  Work-up in the Emergency Department with a CT head without contrast and EKG was unremarkable.  As mentioned above, initial CT head unremarkable.   PAST MEDICAL HISTORY: 1.  COPD, uses oxygen at night.  2.  History of DVT.  3.  Congestive heart failure.  4.  Hyperlipidemia.  5.  Hypothyroidism.  6.  Chronic constipation.  7.  Depression.  8.  Hyperlipidemia.   PAST SURGICAL HISTORY: 1.  Lesion on epiglottis taken off recently.  2.  Defibrillator. 3.  Appendectomy.  4.  Partial hysterectomy.  5.  Cholecystectomy.  6.  Benign breast tumor.   ALLERGIES:  CODEINE.   HOME MEDICATIONS: 1.  Synthroid 44 mcg once a day.  2.  Symbicort 2 puffs 2 times a day.  3.  Spiriva 18 mcg once a day.  4.  ProAir 2 puffs every 4 to 6 hours as needed.  5.  Prednisone on tapering dose.  6.  Os-Cal 2 times a day.  7.  Omeprazole 20 mg 2 times a day.  8.  Lovaza 2 capsules 2 times a day.  9.  Lisinopril 2.5 mg once a day.  10.  Lexapro 2.5 mg once a day.  11.  Lasix 10 mg once a day.  12.  Digoxin 125 mcg once a day.  13.  Crestor 10 mg once a day.  14.  Clonazepam 1 mg 2 times a day.  15.  Carvedilol 6.25 mg 2 times a day.  16.  Bupropion 300 mg 1 tablet once a day.  17.  Bactrim 1  tablet once a day.  18.  Aspirin 81 mg once a day.  19.  Amitriptyline 100 mg once a day.  20.  Alendronate 70 mg once a week.  21.  Albuterol inhalation every 4 hours as needed.  22.  Abilify 10 mg once a day.   SOCIAL HISTORY:  Recently quit smoking a few months back.  Denies drinking alcohol or using illicit drugs.  Currently lives with her daughter.   FAMILY HISTORY:  Father died in his 71s from MI.  Mother died in her 75s from MI.  Sister died of bone cancer and another sister with breast cancer.   REVIEW OF SYSTEMS: CONSTITUTIONAL:  Denies any fever, generalized weakness.  EYES:  No change in vision.  EARS, NOSE, THROAT:  No change in hearing, sore throat.  CARDIOVASCULAR:  No chest pain, palpitations.  No pedal edema.  RESPIRATORY:  Has cough.  Has mild shortness of breath with exertion.  GASTROINTESTINAL:  No nausea, vomiting, abdominal pain.  GENITOURINARY:  No dysuria or hematuria.  MUSCULOSKELETAL:  Has generalized body aches.  SKIN:  No rash or lesions.  ENDOCRINE:  History of hypothyroidism.  No polyuria or polydipsia.  PSYCHIATRIC:  The patient  has depression.   PHYSICAL EXAMINATION: GENERAL:  This is a well-built, well-nourished, age-appropriate female lying down in the bed, not in distress.  VITAL SIGNS:  Temperature 98.7, pulse 94, blood pressure 128/69, respiratory rate of 16, oxygen saturation is 97% on room air.  HEENT:  Head normocephalic, atraumatic.  Eyes, no sclerae icterus.  Conjunctivae normal.  Pupils equal and react to light.  Extraocular movements are intact.  Mucous membranes, mild dryness.  No pharyngeal erythema.  NECK:  Supple.  No lymphadenopathy.  No JVD.  No carotid bruit.  CHEST:  Has no focal tenderness.  LUNGS:  Bilateral clear to auscultation.  HEART:  S1, S2 regular.  No murmurs are heard.  No pedal edema.  Pulses 2+.  ABDOMEN:  Bowel sounds plus.  Soft, nontender, nondistended.  No hepatosplenomegaly.  SKIN:  No rashes or lesions.   MUSCULOSKELETAL:  Good range of motion in all the extremities.  NEUROLOGIC:  The patient is alert, oriented to place, person and time.  Cranial nerves II through XII intact.  Motor 5 by 5 in upper and lower extremities.  Sensory intact bilaterally equally.   LABORATORY DATA:  UA negative for nitrites and leukocyte esterase.  Chest x-ray, one view, portable, probable fibrosis and subsegmental atelectasis at the right lung base.   CT head without contrast, no acute intracranial abnormality.   Troponins are negative.   CMP is completely within normal limits.   CBC, WBC of 13.4.   ASSESSMENT AND PLAN:  Yolanda Price is a 69 year old female who comes to the Emergency Department with right-sided numbness.  1.  Cerebrovascular accident.  The patient states still has persistence of the symptoms.  Admit the patient to the monitored bed, give 325 mg of aspirin, statin.  We will obtain echocardiogram and carotid Dopplers.  The patient has a defibrillator.  The patient is not a candidate for MRI.  2.  Hypertension, currently well-controlled.  Continue with the home medications.  3.  Hypothyroidism.  Continue with Synthroid.  4.  Chronic obstructive pulmonary disease.  Has mild cough.  The patient states it is chronic and does not have any shortness of breath.  5.  Keep the patient on DVT prophylaxis with Lovenox.   TIME SPENT:  40 minutes.     ____________________________ Susa GriffinsPadmaja Thomasine Klutts, MD pv:ea D: 11/26/2012 23:50:35 ET T: 11/27/2012 01:02:31 ET JOB#: [id number]  cc: Susa GriffinsPadmaja Krrish Freund, MD, <Dictator> Onnie BoerKrichna F. Carlynn PurlSowles, MD Susa GriffinsPADMAJA Anmol Paschen MD ELECTRONICALLY SIGNED 12/03/2012 0:22

## 2014-09-10 NOTE — Consult Note (Signed)
PATIENT NAME:  Yolanda Price, GROSE MR#:  [id number] DATE OF BIRTH:  08-Nov-1945  DATE OF CONSULTATION:  12/29/2012  REFERRING PHYSICIAN:  Katharina Caper, MD CONSULTING PHYSICIAN:  Stann Mainland. Sampson Goon, MD  PRIMARY CARE PHYSICIAN:  Dr. Welton Flakes  REASON FOR CONSULTATION: CRE pseudomonal pneumonia.   HISTORY OF PRESENT ILLNESS: This is a pleasant 69 year old woman with a known history of COPD with frequent exacerbations, on home O2, as well as a history of DVT, CHF, AICD placement, hypothyroidism and atrial fibrillation who was admitted on 08/07 with fevers, cough and increasing shortness of breath. The patient had initially been treated with oral prednisone taper and Levaquin 750 mg b.i.d., however, she worsened and came to the Emergency Room where she was admitted.   Since admission, the patient reports she is improved somewhat clinically. She is still somewhat short of breath, but has not been having fevers. Her cultures from admission grew pseudomonas, which was carbapenem resistant.   PAST MEDICAL HISTORY: 1.  COPD, follows with Dr. Welton Flakes, on home O2 with frequent admissions.  2.  DVT. 3.  CHF. 4.  Stroke.  5.  Hyperlipidemia.  6.  Hypothyroidism.  7.  Chronic A-fib. 8.  Status post AICD.  9.  Depression.  10.  Hyperlipidemia.   PAST SURGICAL HISTORY:  1.  Epiglottal surgery for a lesion.  2.  Defibrillator placement.  3.  Appendectomy.  4.  Hysterectomy.  5.  Cholecystectomy.  6.  Benign breast tumor resection.   SOCIAL HISTORY: The patient quit smoking in March 2014. She does not drink or use illegal drugs. She lives with her daughter.   FAMILY HISTORY: Father died of a MI, mother of a MI as well in her 2s.   REVIEW OF SYSTEMS:  Eleven systems reviewed and negative, except as per HPI.  ALLERGIES: CODEINE. She is not allergic to any antibiotics.  ANTIBIOTICS SINCE ADMISSION:   1.  Azithromycin given 08/08 through 08/11.  2.  Ceftazidime begun 08/10. 3.  Zosyn given 08/07 through  08/10. 4.  Vancomycin.  OTHER MEDICATIONS:  1.  Amitriptyline.  2.  Abilify.  3.  Coreg.  4.  Clonazepam.  5.  Digoxin.  6.  Hydromorphone.  7.  Mucinex.  8.  Synthroid.  9.  Lisinopril.  10.  Morphine p.r.n.  11.  Linzess.  12.  Pantoprazole.  13.  Crestor.  14.  Senna.  15.  Aspirin.  16.  Plavix.  17.  Sub-Q Lovenox.  18.  Acetylcysteine 20% SVN.  19.  Combivent nebulizer.  20.  Budesonide Pulmicort inhaler.  21.  Spiriva.  22.  Ensure.  23.  Methylprednisolone.   PHYSICAL EXAMINATION: VITAL SIGNS: T-max over the last 24 hours is 98.0, blood pressure 127/55, pulse 110, respirations 20, sat 97% on 3 liters.  GENERAL: She is a pleasant, interactive, sitting in bed.  HEENT: Pupils equal, round and reactive to light and accommodation. Extraocular movements are intact. Sclerae anicteric. Oropharynx:  Mucous membranes moist. No lesions. No thrush.  NECK: Supple.  HEART: Distant heart sounds, regular.  LUNGS: Coarse breath sounds bilaterally with rhonchi and poor air movement.  ABDOMEN: Soft, nontender, nondistended. No hepatosplenomegaly.  EXTREMITIES: No clubbing, cyanosis or edema.  NEUROLOGIC: She is alert and oriented x 3. Grossly nonfocal neuro exam.   LABORATORY AND DIAGNOSTICS: Culture results 08/07:  The patient had sputum culture grew moderate pseudomonas with normal flora. Pseudomonas was sensitive to Cipro, gentamicin and ceftazidime, but intermediate to imipenem and levofloxacin. Blood cultures 08/07:  Negative x 2.  On 06/20 she had sputum culture growing normal flora. On 05/25 sputum culture grew normal flora.  White blood count on 08/10 was 11.3. White blood count on admission was 15.9, hemoglobin 12.2, platelets 438.   CT of the chest done 08/07 showed worsening lung disease with consolidation in the lower lobe regions bilaterally which may or may not include some areas of mucoid impaction in the distal airways. There is peribronchial thickening. No definite  pneumonia is appreciated.   IMPRESSION: This is a 69 year old with severe chronic obstructive pulmonary disease on home O2 who has had recurrent pneumonias over the last several months. She now presents with pneumonia/chronic obstructive pulmonary disease exacerbation and is growing pseudomonas with carbapenem intermediate sensitivities. Fortunately, the organism is sensitive to ciprofloxacin. She has clinically improved over the last few days.   RECOMMENDATIONS:  1.  At this point, I would suggest we change her to oral ciprofloxacin 750 twice a day to complete a 21 day course.  2.  Would recommend continued pulmonary management of her severe chronic obstructive pulmonary disease and attempts to improve her postural drainage.  3.  I will see her in clinic in follow-up in 3 weeks time to decide on further antibiotic selection or need.   Thank you for the consult. I would be glad to follow with you.  ____________________________ Stann Mainlandavid P. Sampson GoonFitzgerald, MD dpf:sb D: 12/29/2012 13:18:13 ET T: 12/29/2012 13:59:41 ET JOB#: [id number]  cc: Stann Mainlandavid P. Sampson GoonFitzgerald, MD, <Dictator> Brayah Urquilla Sampson GoonFITZGERALD MD ELECTRONICALLY SIGNED 12/29/2012 21:56

## 2014-09-10 NOTE — Discharge Summary (Signed)
PATIENT NAME:  Yolanda Price, Yolanda Price MR#:  [id number] DATE OF BIRTH:  1945-11-18  DATE OF ADMISSION:  11/07/2012 DATE OF DISCHARGE:  11/11/2012  ADMISSION DIAGNOSES:  1.  Acute on respiratory failure.  2.  Pneumonia.  3.  Chronic obstructive pulmonary disease exacerbation.   DISCHARGE DIAGNOSES: 1.  Acute on chronic respiratory failure.  2.  Pneumonia.  3.  Acute chronic obstructive pulmonary disease exacerbation.  4.  Depression.  5.  Hypertension.  6.  Hypothyroidism.  7.  History of congestive heart failure with diastolic dysfunction and well preserved ejection fraction.  8.  Chest pain, likely secondary to pneumonia.   CONSULTANTS:  Dorothyann Peng, MD  PERTINENT LABORATORIES AT DISCHARGE: White blood cells 9.3, hemoglobin 12.4, hematocrit 37 and platelets are 312.   Blood cultures are negative to date.   HOSPITAL COURSE: This is a 69 year old female with a history of COPD who presented with acute on chronic respiratory failure and found to have a pneumonia. For further details, please refer to the H and P.   1.  Acute on chronic respiratory failure secondary to community-acquired pneumonia and COPD exacerbation. She had a CT of chest which was negative for pulmonary emboli, but she does have basilar infiltrate.  Her sputum culture is showing normal flora. She was initially placed on vancomycin, Zosyn and Levaquin. She will be changed to p.o. Levaquin at discharge.  She will need oxygen at discharge, basically for her COPD. 2.  COPD, acute exacerbation. The patient will be changed to p.o. steroids. She was on IV steroids and antibiotics. She quit smoking about 3 months ago. She uses an electronic cigarette.  We encouraged her to continue to stop smoking.  3.  Depression. She will continue home medications.  4.  Hypertension. Well controlled here on lisinopril.  5.  Hypothyroidism.  On Synthroid. 6.  History of CHF with diastolic dysfunction. Her last echo showed an EF of 50%. She is well  compensated and stable.  7.  Chest pain. She was having some chest pain. I think this is secondary to her pneumonia. CT of chest was negative for pulmonary emboli.  Dr. Juliann Pares was consulted and will followup with her as an outpatient.   DISCHARGE MEDICATIONS: 1.  Amitriptyline 100 mg at bedtime.  2.  Abilify 10 mg daily.  3. Clonazepam 1 mg b.i.d.  4.  Bupropion 300 mg daily.  5.  Aspirin 81 mg daily.  6.  Crestor 10 mg daily.  7.  Digoxin 0.125 mg daily.  8.  Lasix 20 mg half tablet 2 to 3 times a week as needed for swelling.  9.  Lexapro 20 mg daily.  10.  Lisinopril 2.5 mg daily.  11.  Lovaza 2 capsules b.i.d.  12.  Omeprazole 20 mg b.i.d.  13.  Os-Cal 1 tablet b.i.d.  14.  Spiriva 18 mcg daily.  15.  Alendronate 70 mg on Sundays.  16.  Coreg 3.125 mg b.i.d.  17.  ProAir 2 puffs 4 times a day p.r.n.  18.  Symbicort 2 puffs b.i.d.  19.  Potassium 20 mEq daily.  20.  Synthroid 88 mcg 1/2 tablet daily.  21.  Linzess 290 mcg daily.  22.  Stress B with vitamin C B complex 1 tablet daily.  23.  Albuterol nebulizer 3 mL q. 6 hours p.r.n.  24.  Prednisone taper starting at 60 mg taper x 10 mg every 3 days.  25.  Levaquin 750 mg for 5 days.  26.  Percocet 5/325  mg 1 to 2 tablets q. 4 to 6 hours p.r.n. pain, #30.   DISCHARGE OXYGEN: 2 liters nasal cannula.   DISCHARGE ACTIVITY: As tolerated.   DISCHARGE FOLLOWUP: The patient will follow up with Dr. Alba CoryKrichna Sowles in 1 week and Dr. Freda MunroSaadat Khan in 1 week.  The patient is medically stable for discharge.  TIME SPENT: Approximately 35 minutes.  ____________________________ Janyth ContesSital P. Juliene PinaMody, MD spm:sb D: 11/11/2012 13:56:52 ET T: 11/11/2012 14:12:11 ET JOB#: [id number]  cc: Madox Corkins P. Juliene PinaMody, MD, <Dictator> Onnie BoerKrichna F. Carlynn PurlSowles, MD Yevonne PaxSaadat A. Khan, MD Janyth ContesSITAL P Hopelynn Gartland MD ELECTRONICALLY SIGNED 11/11/2012 14:39

## 2014-09-10 NOTE — Consult Note (Signed)
   Present Illness 69 yo female with history of idiopathic cardiomyopathy with insignificant coronary artery disease by cath in 2006. She has an aicd in place isnce 2006 with a generator change out done in 2011. She was recently admitted with pseudomonas pneumonia and has been readmitted with increased shortness of breath and edema in her legs and face. She has been on a variety of diuretics with dehydration when on dual diuretic therapy with metolzone and and lasix. She is resting farily comfortably currently. CXR shows possible pneumonia. No significant pulmoanry edema. She has ruled out for an mi. She denies chest pain.   Physical Exam:  GEN well nourished, obese   HEENT PERRL, hearing intact to voice   NECK supple   RESP normal resp effort  rhonchi   CARD Regular rate and rhythm   ABD denies tenderness  normal BS   LYMPH negative neck   EXTR positive edema   SKIN normal to palpation   NEURO cranial nerves intact, motor/sensory function intact   PSYCH A+O to time, place, person   Review of Systems:  Subjective/Chief Complaint shortness of breath   General: Fatigue  Weakness   Skin: No Complaints   ENT: No Complaints   Eyes: No Complaints   Respiratory: Short of breath   Cardiovascular: Dyspnea  Edema   Gastrointestinal: No Complaints   Genitourinary: No Complaints   Vascular: No Complaints   Musculoskeletal: No Complaints   Neurologic: No Complaints   Hematologic: No Complaints   Endocrine: No Complaints   Psychiatric: No Complaints   Review of Systems: All other systems were reviewed and found to be negative   Medications/Allergies Reviewed Medications/Allergies reviewed   EKG:  Interpretation v paced rhythm    Codeine: Other   Impression 69 yo female with history of idiopathic cardiomyopathy with aicd in place. Recent admission with pseudomonas pneumonia. Now readmitted with progressive shorntess of breath and peripheral edema. CXR does not  suggest  significant pulmonary edema. Echo pending. Improved with oxygen , diuretics and antibiotics. Will carefully diurese and follow renal funciton.   Plan 1. Review echo when available 2. Continue emperic antibiotics 3. Determine systollic/diastollic heart failure peding echo result 4. OK to transfer to telemetry   Electronic Signatures: Dalia HeadingFath, Rishit Burkhalter A (MD)  (Signed 10-Oct-14 09:20)  Authored: General Aspect/Present Illness, History and Physical Exam, Review of System, EKG , Allergies, Impression/Plan   Last Updated: 10-Oct-14 09:20 by Dalia HeadingFath, Cynthis Purington A (MD)

## 2014-09-10 NOTE — Discharge Summary (Signed)
PATIENT NAME:  Yolanda Price, Yolanda Price MR#:  [id number] DATE OF BIRTH:  06-18-1945  DATE OF ADMISSION:  12/25/2012 DATE OF DISCHARGE:  12/31/2012  ADMITTING DIAGNOSIS: Pneumonia.   DISCHARGE DIAGNOSES: 1.  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  2.  Chronic obstructive pulmonary disease exacerbation due to pneumonia.  3.  Chronic respiratory failure, oxygen dependent, on 2 liters of oxygen per nasal cannula 24/7.  4.  Right-sided pleuritic chest pain, likely pulled muscle due to cough.  5.  Chronic T4 fracture.  6.  Constipation.  7.  Leukocytosis.  8.  Anemia. 9.  History of atrial fibrillation, chronic, not on anticoagulation.  10.  Congestive heart failure, chronic, unknown systolic  or diastolic.  11.  History of automatic implantable cardiac defibrillator.  12.  Deep vein thrombosis.  13.  Cerebrovascular accident.  14.  Hyperlipidemia.  15.  Hypothyroidism.  16.  Depression.   DISCHARGE CONDITION: Stable.   DISCHARGE MEDICATIONS:  1.  Continue amitriptyline 100 mg p.o. at bedtime.  2.  Abilify 10 mg p.o. at bedtime.  3.  Clonazepam 1 mg p.o. twice daily.  4.  Bupropion extended release 300 mg p.o. daily.  5.  Crestor 10 mg p.o. once daily.  6.  Digoxin 125 mcg p.o. daily.  7.  Lexapro 20 mg p.o. daily.  8.  Lisinopril 2.5 mg p.o. daily.  9.  Lovaza 1000 mg 2 capsules twice daily.  10.  Spiriva 1 inhalation capsule once daily.  11.  Symbicort 160/4.5, 2 puffs twice daily.  12.  Synthroid 88 mcg 1/2 tablet p.o. daily.  13.  Carvedilol 6.25 mg p.o. twice daily.  14.  Furosemide 20 mg p.o. 1/2 tablet once daily as needed.  15.  ProAir HFA 2 puffs every 4 to 6 hours as needed.  16.  Albuterol 2.5 in 3 mL inhalation solution, 3 mL every 4 to 6 hours as needed.  17.  Plavix 75 mg p.o. daily.  18.  Aspirin 81 mg p.o. twice daily.  19.  Pantoprazole 40 mg p.o. daily.  20.  Linzess 290 mcg p.o. daily.   New medications: 1.  Prednisone 50 mg p.o.  day 1, then taper by 10 mg every 2 days until stop.  2.  Acetaminophen/oxycodone 325/7.5 mg 1 tablet 3 times a day.  3.  Benzonatate 100 mg 3 times daily.  4.  Lidocaine 5% topical film, apply to affected area once a day.  5.  Guaifenesin 600 mg p.o. twice daily.  6.  Docusate sodium 100 mg p.o. twice daily.  7.  Senna 1 tablet once daily as needed.  8.  Ciprofloxacin 750 mg p.o. twice daily for 21 days.   HOME OXYGEN: With portable tank at 2 liters of oxygen through nasal cannula 24/7.   DIET: 2 grams salt, low fat, low cholesterol.  Dietary supplements, Ensure, twice a day. Diet consistency: Regular.  ACTIVITY LIMITATIONS: As tolerated.   REFERRAL: To Home Health physical therapy 2 to 7 times a week.   FOLLOW-UP APPOINTMENT: With Dr. Carlynn PurlSowles in 2 days after discharge, Dr. Sampson GoonFitzgerald in 1 to 2 weeks after discharge.   CONSULTANTS: Care Management, Dr. Sampson GoonFitzgerald. Dr. Belia HemanKasa.   LABORATORY AND RADIOLOGICAL DATA:  Chest PA and lateral, 12/25/2012, showed no acute disease of the chest.  CT scan of chest for pulmonary embolism with contrast 12/25/2012 revealed no evidence of pulmonary embolism. No thoracic aortic aneurysm or dissection. Worsening lung disease with some consolidation in the lower lobe regions bilaterally which may include  some areas of mucoid impaction in distal airways. There is evidence of peribronchial thickening suggestive of bronchitis. No evidence of pneumonia is appreciated. Early lower lobe pneumonia would be difficult to exclude from the list of differential considerations, however.  Pulmonary follow-up was recommended.  Appearance was slightly better than it was seen in 10/2012, especially in the left lower lobe.  The included upper abdominal structures show probable spreading hemangioma. There is a cyst which appears to arise from upper midportion of left kidney with a second cyst in the mid left kidney. Incidental note was made of a large amount of residual contrast within  the colon presumably from CT scan of abdomen and pelvis performed on 12/04/2012. Typically, this would have cleared by now. Correlate for underlying constipation. Increased water consumption may be beneficial. Laxatives could be considered, according to radiologist.  Thoracic spine AP and lateral, 12/29/2012, showed mild loss of height in multiple thoracic vertebral bodies. Compression fracture in the lower thoracic spine is not excluded but may be chronic. CT was available for further assessment if patient has acute symptoms, especially in the lower thoracic region, according to radiologist.  CT scan of thoracic spine without contrast, 12/29/2012, revealed old T4 vertebral body compression fracture unchanged as compared to  prior exam done in 10/2012. There were also areas of sclerosis as well as compression deformity which may be related to the compression fracture, but underlying pathology  cannot be excluded, according to radiologist.  The patient's lab data done on 12/25/2012 revealed a glucose of 148, BUN of 19, sodium 132, otherwise BMP was unremarkable. Beta-type natriuretic peptide was 34. The patient's liver enzymes revealed albumin level of 3.1 and mild elevation of AST to 46. Cardiac enzymes x 1 were normal. White blood cell count was elevated to 15.9, hemoglobin was 12.2 and platelet count was 438. Blood cultures taken on 08/07 08/2012 did not show any growth. Influenza testing was also negative. Sputum culture taken on 12/25/2012 revealed moderate growth Pseudomonas aeruginosa with normal flora.  It was a good specimen, and it did appear that the patient's pseudomonas was sensitive to ciprofloxacin, gentamicin as well as ceftazidime, however, intermediately sensitive to imipenem, levofloxacin. Because of that, the patient's pseudomonas is considered to be CRE or carbapenem-resistant Enterobacteriaceae.    HISTORY AND PHYSICAL:  The patient is a 69 year old Caucasian female with history of  pulmonary fibrosis, chronic respiratory failure, who presented to the hospital with complaints of chest pain as well as cough.  Please refer to Dr. Larose Hires admission note on 12/25/2012.   On arrival to the Emergency Room, the patient's temperature was 97.9, pulse was 104, respiratory rate was 22, blood pressure 120/62. Pulse oximetry was 89% on 2 liters of oxygen through nasal cannula and 93 on 3 liters of oxygen. The patient's physical exam revealed a good respiratory effort but wheezing present in the chest as well as decreased air entrance bilaterally due to severe emphysema. EKG showed atrial fibrillation with paced rhythm.   HOSPITAL COURSE: The patient was initiated on broad-spectrum antibiotic initially.  She was consulted by a pulmonologist as well as later on by infectious disease specialist, Dr. Sampson Goon.  Dr. Sampson Goon thought that the patient should continue antibiotic therapy for at least 3 weeks, and he recommended antibiotics with oral antibiotic ciprofloxacin at high doses, 750 mg p.o. twice daily dose. With this therapy, the patient's condition improved, and she is being discharged home for 3-weeks antibiotic therapy. She is to follow up with her primary care physician as  well as Dr. Sampson Goon for further recommendations.   In regards to chronic obstructive pulmonary disease exacerbation, it was related to pneumonia. The patient is to continue steroid tapering as well as inhalation therapy as she previously used. She is to continue oxygen therapy. She was weaned down to 2 liters of oxygen through nasal cannula, and her O2 sats were 95% on room air at rest. Her vital signs day of discharge: Temperature 97.6, pulse was 83, respiration rate was 20, blood pressure 115 to 150s systolic and 70s and 80s diastolic, and O2 sats were 95 on 2 liters of oxygen per nasal cannula as mentioned above.   In regards to right-sided pleuritic chest pain, it was unclear why the patient was having pain.   However, CT scan of chest did not show any pulmonary embolism or cracked ribs. We were concerned about  possible thoracic vertebral fracture. However, CT scan of the thoracic spine did not show also any significant changes or acute changes.  We felt that very likely the patient's right-sided chest pain was related to musculoskeletal reasons such as possibly even pulled muscle during the cough episode.   In regards to constipation, the patient was given medications, and her constipation was relieved.  The patient's leukocytosis also was improving with antibiotic therapy. It is recommended to follow the patient's white blood cell count as outpatient and make decisions about following her closely. In regards to anemia, the patient's anemia was noted with rehydration. On the day of admission, the patient's hemoglobin level was normal at 12.2. However, with mild rehydration, the patient's hemoglobin dropped down to 11.9 on 12/26/2012. It, however, remained stable; by 12/28/2012, it was around 11.1. It is recommended to follow the patient's hemoglobin level as she is on multiple medications which could cause GI bleed. In regards to a-fib, chronic, the patient was not on any anticoagulation except as mentioned above medications.  Her heart rate was well controlled. For chronic CHF, the patient had no symptoms. She is to resume her outpatient medications. For history of stroke, she is to continue aspirin.  For hyperlipidemia, she is to continue her outpatient management with Crestor, Lovaza. For hypothyroidism, she is to continue Synthroid.  She is being discharged in stable condition with the above-mentioned medications and follow-up.   TIME SPENT: 40 minutes.   ____________________________ Katharina Caper, MD rv:cb D: 12/31/2012 16:23:13 ET T: 12/31/2012 22:24:55 ET JOB#: [id number]  cc: Katharina Caper, MD, <Dictator> Onnie Boer. Carlynn Purl, MD Stann Mainland. Sampson Goon, MD Katharina Caper MD ELECTRONICALLY SIGNED 01/07/2013  12:20

## 2014-09-10 NOTE — Consult Note (Signed)
Chief Complaint:  Subjective/Chief Complaint Pt offers no GI complaints. Denies being SOB. However, O2 sat 90% this AM on 2L. Then, 88% on 2L during lunch. Even with 4L of O2, O2 sat only went up to 92%. Therefore, EGD had to be cancelled.   VITAL SIGNS/ANCILLARY NOTES: **Vital Signs.:   25-Sep-14 13:39  Vital Signs Type Pre-Blood  Temperature Temperature (F) 98.2  Celsius 36.7  Temperature Source oral  Pulse Pulse 102  Respirations Respirations 18  Systolic BP Systolic BP 962  Diastolic BP (mmHg) Diastolic BP (mmHg) 63  Mean BP 78  Pulse Ox % Pulse Ox % 92  Oxygen Delivery 4L   Brief Assessment:  GEN no acute distress   Cardiac Regular   Respiratory rhonchi   Lab Results: Routine Chem:  25-Sep-14 05:13   Glucose, Serum 77  BUN 10  Creatinine (comp) 0.81  Sodium, Serum  130  Potassium, Serum 3.8  Chloride, Serum  93  CO2, Serum  33  Calcium (Total), Serum 8.7  Anion Gap  4  Osmolality (calc) 259  eGFR (African American) >60  eGFR (Non-African American) >60 (eGFR values <79m/min/1.73 m2 may be an indication of chronic kidney disease (CKD). Calculated eGFR is useful in patients with stable renal function. The eGFR calculation will not be reliable in acutely ill patients when serum creatinine is changing rapidly. It is not useful in  patients on dialysis. The eGFR calculation may not be applicable to patients at the low and high extremes of body sizes, pregnant women, and vegetarians.)  Routine Hem:  25-Sep-14 05:13   WBC (CBC)  12.0  RBC (CBC)  2.58  Hemoglobin (CBC)  6.8  Hematocrit (CBC)  21.0  Platelet Count (CBC) 415  MCV 82  MCH 26.5  MCHC 32.5  RDW  16.0  Neutrophil % 70.6  Lymphocyte % 19.4  Monocyte % 8.3  Eosinophil % 1.4  Basophil % 0.3  Neutrophil #  8.5  Lymphocyte # 2.3  Monocyte #  1.0  Eosinophil # 0.2  Basophil # 0.0 (Result(s) reported on 12 Feb 2013 at 05:46AM.)   Assessment/Plan:  Assessment/Plan:  Assessment Fe def anemia.  COPD. EGD cancelled.   Plan Will try again tomorrow AM IF breathing improves. NPO after MN. Diet ordered for today. Explained situation with patient and daughter. Thanks   Electronic Signatures: OVerdie Shire(MD)  (Signed 25-Sep-14 13:56)  Authored: Chief Complaint, VITAL SIGNS/ANCILLARY NOTES, Brief Assessment, Lab Results, Assessment/Plan   Last Updated: 25-Sep-14 13:56 by OVerdie Shire(MD)

## 2014-09-10 NOTE — H&P (Signed)
PATIENT NAME:  Yolanda AltesBROWN, Analisse G MR#:  [id number] DATE OF BIRTH:  05-01-1946  DATE OF ADMISSION:  11/07/2012  PRIMARY CARE PHYSICIAN: Onnie BoerKrichna F. Sowles, MD  CHIEF COMPLAINT: Not feeling well.   HISTORY OF PRESENT ILLNESS: This is a 69 year old female who presents to the ER with fever, chills, sweats, cough, grayish sputum, pain in the chest radiating around to the back, these symptoms going on for 3 days. She was vomiting all day yesterday. In the ER, she was found to be hypoxic, 89% on room air and on oxygen 94%, found to have an elevated white count and possible pneumonia on chest x-ray. The patient was also tachycardic.   PAST MEDICAL HISTORY: Oxygen at night, history of DVT in the past, COPD, history of congestive heart failure, hyperlipidemia, hypothyroidism, chronic constipation, depression and hyperlipidemia.   PAST SURGICAL HISTORY: Lesion on epiglottis taken off recently, defibrillator, appendectomy, partial hysterectomy, cholecystectomy, benign breast tumors.   ALLERGIES: PURE CODEINE.   MEDICATIONS: Include ProAir HFA p.r.n., Symbicort 160/4.5 one inhalation twice a day, Combivent 1 inhalation 4 times a day, furosemide 20 mg 1/2 every other day, alendronate 70 mg once a week, Linzess  290 mcg 1 capsule daily, digoxin 125 mcg daily, Synthroid 88 mcg 1 tablet daily, Coreg 3.25 mg 1 tablet twice a day, amitriptyline 100 mcg at bedtime, calcium and vitamin D 1 tablet b.i.d., Abilify 10 mg at bedtime, Spiriva 1 inhalation daily, lisinopril 2.5 mg daily, Lexapro 20 mg daily, potassium chloride 20 mEq daily, clonazepam 1 mg twice a day, omeprazole 20 mg 2 daily, Lovaza 2 tablets twice a day, Crestor 10 mg daily, Wellbutrin XL 300 mg extended-release daily, aspirin 81 mg daily.   SOCIAL HISTORY: Quit smoking 3 months ago, is on e-cigarettes. No alcohol. No drug use. Lives with a daughter. Is on disability. 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   FAMILY HISTORY: Father died in his 8150s of an  MI. Mother died in her 6660s of an MI. Sister died of bone cancer, another sister with breast cancer.   REVIEW OF SYSTEMS:    CONSTITUTIONAL: Positive for fever. Positive for chills. Positive for sweats. Positive for weight gain. Positive for fatigue.  EYES: Does wear glasses.  EARS, NOSE, MOUTH, MOUTH AND THROAT: Positive for sore throat.  CARDIOVASCULAR: Positive for left-sided chest pain wrapping around to the back.  RESPIRATORY: Positive for shortness of breath. Positive for cough, gray phlegm, occasional hemoptysis. Positive for wheeze.  GASTROINTESTINAL: Positive for nausea and vomiting. No abdominal pain. Positive for constipation. No bright red blood per rectum. No melena.  GENITOURINARY: No burning on urination. No hematuria.  MUSCULOSKELETAL: No joint pain.  INTEGUMENTARY: No rashes or eruptions.  NEUROLOGIC: No fainting or blackouts.  PSYCHIATRIC: On medication for depression.  ENDOCRINE: Positive for goiter and hypothyroidism.  HEMATOLOGIC AND LYMPHATIC: No anemia.   PHYSICAL EXAMINATION: VITAL SIGNS: Temperature 99.1, pulse 101, respirations 18, blood pressure 134/58, pulse oximetry 94% on 3 liters, was 89% on room air.  GENERAL: No respiratory distress.  EYES: Conjunctivae and lids normal. Pupils equal, round and reactive to light. Extraocular muscles intact. No nystagmus.  EARS, NOSE, MOUTH AND THROAT: Tympanic membranes: No erythema. Nasal mucosa: No erythema. Throat: No erythema. No exudate seen. Lips and gums: No lesions.  NECK: No JVD. No bruits. No lymphadenopathy. Positive for thyromegaly. No lesions felt.  RESPIRATORY: Decreased breath sounds bilaterally. Positive rhonchi throughout entire lung field. Positive wheeze upper airway. Occasionally uses accessory muscles to breathe.  CARDIOVASCULAR:  S1, S2, tachycardic. No gallops, rubs or murmurs heard. Carotid upstroke 2+ bilaterally, no bruits. Dorsalis pedis pulses 2+ bilaterally. Trace edema of the lower extremity.   ABDOMEN: Soft, nontender. No organomegaly/splenomegaly. Normoactive bowel sounds. No masses felt.  LYMPHATIC: No lymph nodes in the neck.  MUSCULOSKELETAL: Trace edema. No clubbing. No cyanosis.  SKIN: No ulcers or lesions.  NEUROLOGIC: Cranial nerves II through XII grossly intact. Deep tendon reflexes 2+ bilateral lower extremities.  PSYCHIATRIC: The patient is oriented to person, place and time.   LABORATORY, DIAGNOSTIC AND RADIOLOGICAL DATA: Urinalysis negative. Troponin negative. White blood cell count 17.4, hemoglobin and hematocrit 13.1 and 39.3, platelet count of 233. Glucose 102, BUN 11, creatinine 0.68, sodium 130, potassium 4.4, chloride 94, CO2 of 31, calcium 9.6. Chest x-ray: Possible pneumonia, right lower lobe. EKG: Paced, 117 beats per minute, PVCs, left bundle branch block.   ASSESSMENT AND PLAN: 1.  Acute respiratory failure: Requiring oxygen to saturate well. Continue oxygen.  2.  Clinical sepsis with leukocytosis, tachycardia and fever and pneumonia: We will get aggressive with antibiotics since she has recently been in the hospital. Give vancomycin, Zosyn and Levaquin. Follow up blood cultures and sputum culture.  3.  Chronic obstructive pulmonary disease exacerbation: Will put on Solu-Medrol, aggressive antibiotics, and continue her inhalers and nebulizer treatments.  4.  History of congestive heart failure: I do not think this patient is in heart failure at this time. Will continue her usual medications.  5.  Hypothyroidism: Continue levothyroxine.  6.  Depression: Continue all of her psychiatric medications.  7.  Hyperlipidemia: Continue Crestor.   TIME SPENT ON ADMISSION: 60 minutes.   CODE STATUS: The patient is a DO NOT RESUSCITATE.    ____________________________ Herschell Dimes. Renae Gloss, MD rjw:jm D: 11/07/2012 18:29:52 ET T: 11/07/2012 19:21:40 ET JOB#: [id number]  cc: Herschell Dimes. Renae Gloss, MD, <Dictator> Onnie Boer. Carlynn Purl, MD Salley Scarlet MD ELECTRONICALLY  SIGNED 11/17/2012 15:39

## 2014-09-10 NOTE — Discharge Summary (Signed)
PATIENT NAME:  Yolanda Price, Yolanda Price MR#:  [id number] DATE OF BIRTH:  Feb 18, 1946  DATE OF ADMISSION:  02/08/2013 DATE OF DISCHARGE:  02/16/2013  DISPOSITION: Discharged to skilled nursing facility.   DISCHARGE DIAGNOSES: 1.  Acute on chronic respiratory failure.  2.  Pseudomonas pneumonia.  3.  Candidal esophagitis. 4.  Gastrointestinal bleed.  5.  Acute blood loss anemia.  6.  Iron deficiency.  7.  Syncope.  8.  Hyponatremia.  9.  Hypokalemia.  10.  Chronic obstructive pulmonary disease exacerbation.  11.  History of atrial fibrillation.  12.  Acute on chronic diastolic congestive heart failure.   CONSULTANTS: Dr. Belia Heman of pulmonary, Dr. Bluford Kaufmann of GI.   IMAGING STUDIES DONE: Include CT scan of the head without contrast which showed no acute intracranial process.   CT scan of the chest without contrast showed bibasilar densities concerning for chronic infiltrates, emphysematous changes.   Repeat chest x-ray, PA and lateral, showed atelectasis versus infiltrate, small, of the right lung base.   ADMITTING HISTORY AND PHYSICAL: Please see detailed H and P dictated by Dr. Enedina Finner on 02/08/2013. In brief, a 69 year old Caucasian female patient with multiple admissions with pneumonia along with pseudomonas pneumonia who presented to the hospital complaining of syncope. The patient was found to be dehydrated, acute on chronic renal failure, and acute hyponatremia and admitted to the hospitalist service. The patient also had some mild worsening shortness of breath.   HOSPITAL COURSE: 1.  Syncope. The patient was dehydrated at the time of admission. Was both on Lasix and metolazone which caused significant diuresis and dehydration with hypokalemia and hyponatremia. Diuretics were held, started on IV fluids with which her hyponatremia improved, and she did not have any lightheadedness or dizziness by the day of discharge. Her Lasix dose will be continued, but the patient will be stopped from taking the  metolazone at the time of discharge.  2.  Pseudomonas pneumonia with acute COPD exacerbation. The patient had mild acute on chronic respiratory failure needing more oxygen than normal. The patient was continued with her ciprofloxacin she was on. The patient was seen by Dr. Belia Heman who suggested continuing her COPD treatment along with the antibiotics. The patient was afebrile. White count is mildly elevated after starting her on steroids.  3.  Candida esophagitis with acute blood loss anemia. The patient had acute blood loss anemia with drop in hemoglobin significantly. Had 1 unit of packed RBC transfused and presently hemoglobin has been stable over 3 days. She has been started on iron supplementation. Dr. Bluford Kaufmann with GI saw the patient, had an endoscopy, EGD done, which showed candida esophagitis for which we have started her on fluconazole IV in the hospital and she will be on oral fluconazole for 2 weeks after discharge. This was likely secondary to the steroid and antibiotic treatment she has had multiple times in the recent past. Presently, the patient does not have any melena or hematochezia or hematemesis, is tolerating her food well.  4.  Paroxysmal atrial fibrillation. The patient is continued on her digoxin. Was hypotensive briefly, but will be started on the Coreg. I will hold lisinopril. Secondary to her GI bleed, I have held her aspirin and Plavix, but can restart an aspirin in a week. She will need follow-up with her cardiologist in 1 to 2 weeks.   Today on examination, the patient has minimal wheezing. Still on 3 liters oxygen which needs to be continued.   DISCHARGE MEDICATIONS: Include:  1.  Crestor 10  mg oral once a day.  2.  Digoxin 125 mcg daily.  3.  Lexapro 20 mg daily.  4.  Lovaza 2 capsules oral 2 times a day 1000 mg.  5.  Synthroid 88 mcg 1/2 tablet daily.  6.  ProAir HFA 2 puffs inhaled every 4 hours as needed for shortness of breath.  7.  Lasix 10 mg oral once a day.  8.   Amitriptyline 100 mg oral once a day.  9.  Protonix 20 mg oral 2 times a day.  10.  Spiriva 18 mcg inhaled daily.  11.  Bupropion 300 mg XL oral once a day.  12.  Linzess 290 mcg oral once a day.  13.  Acetaminophen/oxycodone 325/7.5 mg 1 tablet oral 3 times a day as needed for pain.  14.  Aspirin 81 mg daily. Start after 1 week, from 02/24/2013.  15.  Clonazepam 2 mg oral 2 times a day.  16.  Coreg 3.125 mg oral 2 times a day.  17.  DuoNeb 3 mL inhaled 4 times a day as needed.  18.  Potassium chloride 20 mEq oral once a day.  19.  Guaifenesin 600 mg oral every 12 hours.  20.  Docusate sodium 100 mg oral 2 times a day as needed for constipation.  21.  Advair Diskus 250/50 one puff inhaled 2 times a day.  22.  Diflucan 200 mg oral once a day.  23.  Ferrous sulfate 325 mg oral 2 times a day with meals.   DISCHARGE INSTRUCTIONS: Continue oxygen at 3 liters with portable tank, low-sodium diet, activity as tolerated with assistance. The patient will need further physical therapy. Follow up with Dr. Bluford Kaufmannh of GI in 1 to 2 weeks and her cardiologist in 1 to 2 weeks. To be seen by rehab physician. Watch out for any bleeding or melena.   TIME SPENT: On day of discharge in discharge activity was 46 minutes. ____________________________ Molinda BailiffSrikar R. Breydon Senters, MD srs:sb D: 02/16/2013 12:59:49 ET T: 02/16/2013 13:41:57 ET JOB#: [id number]  cc: Wardell HeathSrikar R. Elisabel Hanover, MD, <Dictator> Orie FishermanSRIKAR R Johnnie Goynes MD ELECTRONICALLY SIGNED 02/18/2013 15:32

## 2014-09-11 NOTE — Discharge Summary (Signed)
PATIENT NAME:  Yolanda Price, Yolanda Price MR#:  [id number] DATE OF BIRTH:  06-08-1945  DATE OF ADMISSION:  10/21/2013 DATE OF DISCHARGE:  10/26/2013  PRIMARY CARE PHYSICIAN: Dr. Carlynn PurlSowles.   DISCHARGE DIAGNOSES: 1. Acute on chronic respiratory failure.  2. Chronic obstructive pulmonary disease exacerbation.  3. History of Methicillin-resistant Staphylococcus aureus  pneumonia.  4. Rib fracture.  5. Hypertension.  6. Chronic atrial fibrillation.   CONDITION: Stable.   CODE STATUS: Full code.   HOME MEDICATIONS: Please refer to the medication reconciliation list.   DIET: Low sodium diet.   ACTIVITY: As tolerated.   FOLLOW-UP CARE: Follow with PCP within 1 to 2 weeks. Followup with  Dr. Sampson GoonFitzgerald within 1 to 2 weeks. The patient needs oxygen 2 to 3 liters by nasal cannula. The patient declined home health and physical therapy.   REASON FOR ADMISSION: Worsening shortness of breath, cough, and wheezing for one day.   HOSPITAL COURSE: The patient is a 69 year old Caucasian female with a history of chronic obstructive pulmonary disease on home oxygen, and comes to the hospital due to shortness of breath, wheezing and cough. The patient's O2 sat was low at 80s in the ED, was placed on BiPAP and admitted for acute respiratory failure and chronic obstructive pulmonary disease exacerbation. For detailed history and physical examination, please refer to the admission note dictated by me.   LABORATORY DATA ON ADMISSION DATE: Are as follows: CAT scan did not show any infiltrates, but emphysema.  Also showed left 9th and 10th rib fracture. No pneumothorax. CBC showed pH of 7.24, pCO2 69, pO2 53 with FiO2 of  36.   Chest x-ray did not show any acute findings.   BUN 31,  creatinine 1.32, sodium 127, potassium 5.4. WBC 15.6, hemoglobin 10.5.   HOSPITAL COURSE:  1. Acute on chronic respiratory failure. After admission, the patient has been treated with Solu-Medrol, Topamax, Advair, Spiriva and Zithromax. In  addition, since the patient has a history of MRSA pneumonia, Dr. Sampson GoonFitzgerald suggested Bactrim p.o. for 21 days; however, the patient has sputum culture showed MRSA resistant to Bactrim so I changed to clindamycin p.o. The patient's white count decreased to normal range.  2. Dehydration. The patient's dehydration improved after IV fluid support.  3. Hypertension has been controlled with Coreg and lisinopril.  4. The patient got a physical therapy evaluation. The patient needs home health and physical therapy; however, the patient's declines. She said her daughters can take care of her. The patient's symptoms have much improved today, but is still on 3 liters oxygen by nasal cannula. The patient is clinically stable and will be discharged to home today. I discussed the patient's discharge plan with the patient, nurse, case manager.   TIME SPENT: About 39 minutes.   ____________________________ Shaune PollackQing Barbee Mamula, MD qc:sg D: 10/26/2013 13:42:00 ET T: 10/26/2013 14:21:04 ET JOB#: [id number]  cc: Shaune PollackQing Jerame Hedding, MD, <Dictator> Shaune PollackQING Ashiya Kinkead MD ELECTRONICALLY SIGNED 10/28/2013 14:57

## 2014-09-11 NOTE — H&P (Signed)
PATIENT NAME:  Yolanda Price, Yolanda Price MR#:  [id number] DATE OF BIRTH:  04-May-1946  DATE OF ADMISSION:  11/30/2013  REFERRING PHYSICIAN: Dr. Sharman Cheek.   PRIMARY CARE PHYSICIAN: Dr. Carlynn Purl at [facility]   PRIMARY PULMONOLOGIST: Dr. Freda Munro.   CHIEF COMPLAINT: Shortness of breath, fever, cough, productive sputum, confusion.   HISTORY OF PRESENT ILLNESS: This is a 69 year old female with known history of chronic obstructive pulmonary disease, chronic respiratory failure, on 2 liters nasal cannula, with history of MRSA pneumonia with recurrent admissions, most recent in June of this year, when the patient was treated and discharged on p.o. clindamycin, history of atrial fibrillation, not on anticoagulation, status post pacemaker. The patient presents with above-mentioned complaints. Has been going on for the last two days ago. The patient has been having respiratory distress, cough productive of greenish sputum, febrile at home. As well, the patient was hypoxic at home on 2 liters nasal cannula, which is her baseline. She was 74% on that, requiring higher oxygen requirement, which prompted family to call EMS. As well, they report the patient has been lethargic, confused over the last two days as well. Upon presentation, the patient was found to be afebrile, tachycardic at 108. Had significant leukocytosis at 14,000. Her urinalysis was positive, as well as her chest x-ray, which did show right lung pneumonia. The patient is known to have a history of MRSA pneumonia in the past, most recently admitted on June 3, where she was discharged on p.o. clindamycin and prednisone. As well, the patient complains of chest pain during coughing. The patient initially was saturating in the low 90s on 4 to 5 liters nasal cannula, but then she became hypoxic, where she is being started on BiPAP as well. The patient was started on broad-spectrum IV antibiotics, levofloxacin, Zosyn and vancomycin, after  blood cultures were sent. Hospitalist requested to admit the patient for further work-up and treatment of her acute respiratory failure and pneumonia.   PAST MEDICAL HISTORY: 1.  Chronic obstructive pulmonary disease.  2.  Chronic respiratory failure on 2 liters nasal cannula at baseline.  3.  Chronic hyponatremia.  4.  Recurrent urinary tract infection.  5.  History of melanoma.  6.  History of pacemaker.  7.  Hypertension.  8.  History of deep vein thrombosis in the past.  9.  History of MRSA pneumonia.  10.  Chronic anxiety.  11.  Depression.  12.  Chronic constipation. The patient is taking Linzess.  13.  Chronic back pain.  14.  Chronic atrial fibrillation, not on anticoagulation.  15.  History of chronic diastolic congestive heart failure.  16.  History of CVA.  17.  Hypothyroidism.  18.  Hyperlipidemia.  19.  Coronary artery disease.  20.  Chronic pain syndrome.    PAST SURGICAL HISTORY:  1.  Epiglottic lesion laser surgery by ENT.  2.  Automatic implantable cardiac defibrillator placement.  3.  Appendectomy.  4.  Cholecystectomy.  5.  Benign breast tumor removal.  6.  Partial hysterectomy.    ALLERGIES: CODEINE. HOWEVER SHE IS ABLE TO TOLERATE MORPHINE AND DILAUDID, AND SHE IS ON PERCOCET AT HOME.   SOCIAL HISTORY: The patient is a former smoker of 53 years, quit in March 2014. No alcohol. No illicit drug use. Lives with her daughter. She uses oxygen at baseline.   FAMILY HISTORY: Significant for coronary artery disease. Sister had bone cancer.   HOME MEDICATIONS: 1.  Synthroid 44 mcg oral daily.  2.  Spiriva  18 mcg oral daily.  3.  Albuterol as needed.  4.  Percocet 10/325 as needed.  5.  Omeprazole 40 mg 2 times a day.  6.  Mucinex 600 mg oral every 12 hours.  7.  Lovaza 1000 mg oral 2 capsules twice daily.  8.  Lisinopril 2.5 mg oral daily.  9.  Linzess 290 mcg oral daily.  10.  Lexapro 20 mg oral daily.  11.  Ketoconazole topical 2% apply topically as  needed.  12.  Flomax 0.4 mg oral daily as needed.  13.  Digoxin 125 mcg oral daily.  14.  Crestor 10 mg at bedtime.  15.  Colace 100 mg oral daily.  16.  Clonazepam 2 mg oral twice daily.  17.  Coreg 3.125 mg oral twice daily.  18.  Bupropion 300 mg oral daily.  19.  Benzonatate 100 mg oral 3 times a day as needed.  20.  Aspirin 81 mg daily.  21.  Amitriptyline 100 mg oral in the evening.  22.  Albuterol as needed.  23.  Advair 250/50, 1 puff b.i.d.  24.  Abilify 10 mg oral daily.    REVIEW OF SYSTEMS: CONSTITUTIONAL: Reports fever, chills, fatigue, weakness, poor appetite.  EYES: Denies blurry vision, double vision or inflammation.  ENT: Denies tinnitus, ear pain, hearing loss, epistaxis.  RESPIRATORY: Denies cough, wheezing, productive sputum, and chronic obstructive pulmonary disease.  CARDIOVASCULAR: Reports chest pain upon coughing. Denies edema, palpitations, syncope.  GASTROINTESTINAL: Denies nausea, vomiting, diarrhea, abdominal pain, hematemesis.  GENITOURINARY: Denies dysuria, hematuria, renal colic.  ENDOCRINE: Denies polyuria, polydipsia, heat or cold intolerance.  HEMATOLOGY: Denies anemia, easy bruising, bleeding diathesis.  INTEGUMENT: Denies acne, rash, or skin lesion.  MUSCULOSKELETAL: Denies any gout, arthritis, cramps.  NEUROLOGIC: Denies vertigo, tremors, ataxia. Has confusion.  PSYCHIATRIC: History of anxiety and depression.   PHYSICAL EXAMINATION: VITAL SIGNS: Temperature 98.2, pulse 107, respiratory rate 24, blood pressure 110/57, saturating 92% on 4 liters nasal cannula.  GENERAL: Frail, elderly female ill appearing, lying in bed, in mild respiratory distress.  HEENT: Head atraumatic, normocephalic. Pupils equal, reactive to light. Pink conjunctivae. Anicteric sclerae. Moist oral mucosa.  NECK: Supple. No thyromegaly. No JVD.  CHEST: Fair air entry bilaterally, with scattered wheezing and right lung rales and rhonchi.  CARDIOVASCULAR: S1, S2 heard. No rubs,  murmurs, or gallops. Tachycardic.  ABDOMEN: Soft, nontender, nondistended. Bowel sounds present.  EXTREMITIES: No edema. No clubbing. No cyanosis. Pedal and radial pulses felt bilaterally.  PSYCHIATRIC: The patient is awake, alert x2, mildly confused.  NEUROLOGIC: Cranial nerves grossly intact. No motor or sensory deficits.  MUSCULOSKELETAL: No joint effusion or erythema.   PERTINENT LABORATORY DATA: Glucose 92, BUN 16, creatinine 0.85, sodium 130, potassium 4.4, chloride 93, CO2 30. ALT 21, AST 45, alkaline phosphatase 209. Troponin less than 0.02. White blood cells 14.7, hemoglobin 9.8, hematocrit 29.8, platelets 362. Urinalysis: +1 leukocyte esterase and 7 white blood cells. ABG showing pH of 7.43, pCO2 49, and pO2 of 76. Chest x-ray showing interval development of ill-defined airspace opacities in the right mid lung and lower lobe, concerning for possible pneumonia.   ASSESSMENT AND PLAN: 1.  Sepsis. The patient presents with tachycardia, fever at home, and leukocytosis. This is related to her pneumonia, as well as urinary tract infection.  2.  Healthcare acquired pneumonia. The patient is known to have a history of methicillin-resistant Staphylococcus aureus pneumonia. She will be started on IV vancomycin, levofloxacin and Zosyn for healthcare acquired pneumonia. We will follow on the  sputum cultures and the blood cultures, and adjust antibiotics if needed.  3.  Urinary tract infection. The patient is on Levofloxacin.  4.  Acute on chronic respiratory failure. This is related to patient's chronic obstructive pulmonary disease and pneumonia. We will start the patient on BiPAP at this point, given her respiratory effort and hypoxia on 5 liters nasal cannula.  5.  Chronic obstructive pulmonary disease. We will resume the patient on her home medications, including Advair and Spiriva. We will start her on DuoNebs around the clock and as-needed albuterol and steroids as well, given her wheezing.  6.   Hypertension. Blood pressure is on the lower side, so we will hold all antihypertensive medication.  7.  Chronic anxiety. Will continue with as-needed clonazepam.  8.  Depression. Will resume her back on her home medication.  9.  Hypothyroidism. Continue with Synthroid.  10.  History of atrial fibrillation. Currently the patient has paced rhythm on her EKG. Will check digoxin level, resume her back on it. We will hold Coreg for the time, being given her soft blood pressure, as it is currently 95/60 on telemetry monitor.  11.  Coronary artery disease. Patient complaints of pleuritic chest pain related to her coughing. We will continue to cycle her cardiac enzymes, continue her on aspirin.  12.  Hyperlipidemia. Continue with statin, Lovaza.  13.  Hypothyroidism. Continue with Synthroid.  14.  Deep vein thrombosis prophylaxis. Sequential compression device and subcutaneous heparin.   CODE STATUS: The patient is full code. Discussed with the patient and daughter at bedside.  TOTAL TIME SPENT ON ADMISSION AND PATIENT CARE: 55 minutes.     ____________________________ Starleen Arms, MD dse:cg D: 12/01/2013 00:11:27 ET T: 12/01/2013 01:25:08 ET JOB#: [id number]  cc: Starleen Arms, MD, <Dictator> DAWOOD Teena Irani MD ELECTRONICALLY SIGNED 12/02/2013 1:45

## 2014-09-11 NOTE — Op Note (Signed)
PATIENT NAME:  Yolanda AltesBROWN, Shawntia G MR#:  [id number] DATE OF BIRTH:  April 21, 1946  DATE OF PROCEDURE:  07/02/2013  PREOPERATIVE DIAGNOSIS:  T12, L1, L2 compression fractures.   POSTOPERATIVE DIAGNOSIS:  T12, L1, L2 compression fractures.   PROCEDURE:  T12, L1, L2 biopsy and kyphoplasty.   ANESTHESIA:  MAC.   SURGEON:  Kennedy BuckerMichael Sydney Azure, M.D.   DESCRIPTION OF PROCEDURE:  The patient was brought to the Operating Room and after adequate sedation was given, the patient was placed prone.  C-arm was brought in and good visualization of the three levels was obtained.  After timeout procedure was completed and localizing using x-ray, 20 mL of 1% Xylocaine was infiltrated after prepping the skin with alcohol and the area of the planned incisions.  The back was then prepped and draped in the usual sterile fashion and repeat timeout procedure carried out.  A 20-gauge spinal needle was then used to get local anesthetic, a 50/50 mixture of 1% Xylocaine and 0.5% Sensorcaine with epinephrine in a tracked down to the pedicle to the subcutaneous tissue on both sides of T12 and L2 and just the right side at L1.  A stab incision was made at the T12 and a trocar advanced.  It actually did end up giving access across the midportion of the vertebral body and so only one side was required.  After placing the trocar, a biopsy was obtained and drilling carried out followed by balloon inflation to 4 mL.  This gave some superior endplate correction.  Next, the L2 was addressed with right and left-sided approsches with the trocar advanced, biopsy obtained and then drilling on both sides.  With inflation of the balloon, there was some breakage to the superior endplate into the inner vertebral disk and both sides had about 2 mL of inflation present.  The right-sided stick at L1 gave similar to the T12 level, good placement of the initial trocar for going across the midline biopsy balloon with 3 mL inflation and then leaving all balloons inflated  until the cement was ready and the T12 was filled with approximately 4.5 mL of bone cement with good interdigitation.  A small amount of cement did leak into the T12 to L1 disk space.  It was very small and insertion of cement was stopped at that point.  Going down to L1, 4 mL of bone cement inserted with good fill.  Going down to L2 the left side was filled initially with about 1 mL of cement and then going to the right side going from anterior to posterior the cement filled the vertebral body very well.  There was not much correction possible at L2 secondary to what appeared to be a partially healed fracture with sclerotic bone.  All trocars were removed and permanent C-arm views were obtained.  The wounds were closed with Dermabond, followed by Band-Aids.  The patient was sent to the recovery room in stable condition.   ESTIMATED BLOOD LOSS:  Minimal.   COMPLICATIONS:  None.   SPECIMEN:  T12, L1 and L2 vertebral body biopsies.    ____________________________ Leitha SchullerMichael J. Neysa Arts, MD mjm:ea D: 07/02/2013 19:58:14 ET T: 07/03/2013 01:06:23 ET JOB#: [id number]  cc: Leitha SchullerMichael J. Chaselynn Kepple, MD, <Dictator> Leitha SchullerMICHAEL J Shannen Vernon MD ELECTRONICALLY SIGNED 07/03/2013 12:46

## 2014-09-11 NOTE — Discharge Summary (Signed)
PATIENT NAME:  Yolanda Price, VANZILE MR#:  [id number] DATE OF BIRTH:  06-08-1945  DATE OF ADMISSION:  11/30/2013 DATE OF DISCHARGE:  12/07/2013  ADMITTING DIAGNOSIS: Shortness of breath, cough, confusion.   DISCHARGE DIAGNOSES: 1.  Acute on chronic respiratory failure due to bilateral pneumonia due to previous history of methicillin-resistant Staphylococcus aureus pneumonia in the past. She was treated as healthcare-associated pneumonia; however, she has not been in a facility for the past 1 month, was hospitalized here in June.  2.  Abnormal urinalysis. Urine culture is negative without evidence of urinary tract infection.  3.  Acute on chronic obstructive pulmonary disease exacerbation.  4.  Hypertension.  5.  Anxiety disorder.  6.  Depression.  7.  Hypothyroidism.  8.  History of atrial fibrillation.  9.  Coronary artery disease.  10.  Hyperlipidemia.  11.  Hypothyroidism.  12.  Chronic hyponatremia.  13.  Recurrent urinary tract infections.  14.  History of melanoma.  15.  History of pacemaker.  16.  History of deep vein thrombosis in the past.  17.  History of methicillin-resistant Staphylococcus aureus pneumonia.  18.  Depression.  19.  Chronic constipation.  20.  Chronic back pain.  21.  History of diastolic congestive heart failure in the past.  22.  History of cerebrovascular accident.  23.  Chronic pain syndrome.  24.  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  25.  Status post appendectomy.  26.  Status post cholecystectomy.  27.  Status post benign breast tumor removal.  28.  Status post partial hysterectomy.   PERTINENT LABS AND EVALUATIONS: Admitting glucose 92, BUN 16, creatinine 0.85, sodium 130, potassium 4.4, chloride 93, CO2 30, ALT 21, AST 45, alk phos 209. Troponin less than 0.02. WBC count 14.7, hemoglobin 9.8, platelet count 362,000. Urinalysis: 1+ leukocytes.   ABG showed a pH of 7.43, pCO2 49 and pO2 76.   Chest x-ray showed interval development  of ill-defined airspace opacity in the right mid lung and lower lobe concerning for possible pneumonia.   Blood cultures x2 no growth. Urine culture no growth. Sputum culture showed moderate white blood cells. No organism seen.   HOSPITAL COURSE: Please refer to H and P done by the admitting physician. The patient is a 69 year old white female with history of COPD and chronic respiratory failure who also has a history of MRSA pneumonia who was last hospitalized here in June, presented with shortness of breath, cough, productive sputum, and confusion. The patient was noted to be 67% on 2 liters of oxygen. She also had a leukocytosis of 14,000 in the ED due to her history of MRSA pneumonia. She was admitted and was treated as if she had a healthcare-associated pneumonia. The patient was kept on broad-spectrum antibiotics with improvement in her symptoms. She is currently still on 4 liters, normally she is on 2 liters. She is slow to improve, but with her underlying lung disease it will take time for her to improve. The patient presented with acute respiratory failure and initially on BiPAP, but was subsequently able to be switched over to nasal cannula. She is currently on 4 liters of oxygen. She is very weak and deconditioned. She will need rehab at this time. Arrangements for rehab have been made. The patient is stable for discharge.   DISCHARGE MEDICATIONS: Amitriptyline 100 one tab p.o. at bedtime, clonazepam 2 mg 1 tab p.o. b.i.d., Advair 250/50 one puff b.i.d., Spiriva 18 mcg daily, Lexapro 20 daily, carvedilol 3.125  one tab p.o. b.i.d., ProAir 2 puffs every 4 to 6 as needed for shortness of breath, lisinopril 2.5 daily, digoxin 125 mcg daily, albuterol nebs q. 4 to 6 p.r.n. shortness of breath, Lovaza 2 caps b.i.d., Crestor 10 one tab p.o. at bedtime, aspirin 81 one tab p.o. daily, Abilify 10 daily, bupropion 300 one tab p.o. q. 24, Synthroid 0.4 mg daily, Linzess 290 mcg daily, Colace 100 one tab p.o.  b.i.d., omeprazole 40 mg 1 tab p.o. b.i.d., KCl 20 mEq daily, benzonatate 100 one tab p.o. t.i.d. as needed for cough, Mucinex 600 one tab p.o. q. 12, ketoconazole topically applied to effected area as needed, Percocet 7.5/325 one tab p.o. q. 4 hours p.r.n. for pain, fentanyl 100 mcg one patch every 72 hours, prednisone taper start at 60 mg and taper by 10 until complete, Mucomyst 3 mL inhalation b.i.d., levofloxacin 750 mg one tab p.o. q. 24 for 4 more days, Zyvox 600 one tab p.o. q. 12 x4 days, Ensure 1 tab p.o. b.i.d.  HOME OXYGEN: 4 liters nasal cannula.   DISCHARGE DIET: Low sodium, low fat, low cholesterol.   DISCHARGE ACTIVITY: As tolerated with PT evaluation and treatment.   DISCHARGE INSTRUCTIONS: Follow with primary MD in 1 to 2 weeks. BMP check in 4 days. Flutter valve every 4 hours.  TIME SPENT: 35 minutes on this discharge.   ____________________________ Yolanda Price. [name], MD shp:sb D: 12/07/2013 10:11:18 ET T: 12/07/2013 10:46:34 ET JOB#: [id number]  cc: Williette Loewe H. [name], MD, <Dictator> Alric Seton MD ELECTRONICALLY SIGNED 12/09/2013 12:31

## 2014-09-11 NOTE — Consult Note (Signed)
Present Illness Patient is  69 year old female with history of idiopathic dilated cardiomyopathy with history of insignificant coronary disease by cardiac catheterization in 2006.  She has history of COPD and chronic atrial fibrillation.  She is not currently anticoagulated due to recurrent laryngeal bleeding.  She has a biventricular AICD in place for her cardiomyopathy for primary prevention the device generator was changed out as well as a new right ventricular in February 03, 2010.  While sitting in her doctor's office yesterday she noticed and alarm which she fell was coming from her device.  She denies syncope or presyncope.  She was admitted to the hospital for shortness of breath productive cough.  Interrogation of the device today reveals that it is at RRT.  The device is functioning normally.  Approximately 3-4 months of battery life is remaining.   the generator will need to be changed out after she recovers from this acute illness. the patient has chronic systolic heart failure.  There is no evidence of acute exacerbation neither clinically, by exam or by chest x-ray.  The patient complains of some hemoptysis and cough.   Physical Exam:  GEN obese, disheveled   HEENT PERRL, hearing intact to voice   NECK supple   RESP postive use of accessory muscles  wheezing  rhonchi   CARD Regular rate and rhythm  Murmur   Murmur Systolic   Systolic Murmur axilla   ABD denies tenderness  no Adominal Mass   LYMPH negative neck, negative axillae   EXTR negative cyanosis/clubbing, negative edema   SKIN normal to palpation   NEURO cranial nerves intact, motor/sensory function intact   PSYCH poor insight   Review of Systems:  Subjective/Chief Complaint sob with cough   General: Fatigue  Weakness   Skin: No Complaints   ENT: No Complaints   Eyes: No Complaints   Neck: No Complaints   Respiratory: Frequent cough  Hemopytsis  Short of breath  Wheezing  Sputum   Cardiovascular:  Dyspnea   Gastrointestinal: No Complaints   Genitourinary: No Complaints   Vascular: No Complaints   Musculoskeletal: No Complaints   Neurologic: No Complaints   Hematologic: No Complaints   Endocrine: No Complaints   Psychiatric: No Complaints   Review of Systems: All other systems were reviewed and found to be negative   Medications/Allergies Reviewed Medications/Allergies reviewed   Family & Social History:  Family and Social History:  Family History Non-Contributory   EKG:  Interpretation afib with ventricular paced rhythm    Codeine: Other   Impression 69 yo female with history of idiopathic cardiomyopathy with Bi-V AICD in place for cardiomyopathy and primary prevention. She is admitted with hemoptosis. She has chronic afib and is not anticoagulated due to hemoptosis and laryngeal bleeding in the past. She was noted to have an alarm form her device yesterday. Interogation of her device reveals that it is functioning normally but is at RRT which suggests 3-4 months of battery life remaining on her current generator. This was place in 9/11. She will need the generator changed out when stable form current episode of respiratory failure. Afib is controlled and she is not a candidate for chronic anticoagulation. There is no evidence of acute chf at present. She has chornic systollic chf (NYHA Class III). No evidence of arrythmia or aicd firing. Device is functioning normally.   Plan 1. Continue with carvediolol, lisinopril and crestor at current doses. 2. Conitnue to work up hemoptosis 3. Continue with oxygen and treat with emperic  antibiotics. 4. Will arrange aicd generator change out as outpatient when recovered from respiratory failure   Electronic Signatures: Dalia Heading (MD)  (Signed 09-Oct-15 08:42)  Authored: General Aspect/Present Illness, History and Physical Exam, Review of System, Family & Social History, EKG , Allergies, Impression/Plan   Last Updated:  09-Oct-15 08:42 by Dalia Heading (MD)

## 2014-09-11 NOTE — Discharge Summary (Signed)
PATIENT NAME:  Yolanda AltesBROWN, Yolanda G MR#:  [id number] DATE OF BIRTH:  Jan 02, 1946  DATE OF ADMISSION:  02/25/2014 DATE OF DISCHARGE:  02/27/2014  ADMISSION DIAGNOSES:  1.  Hemoptysis.  2.  Acute exacerbation of chronic obstructive pulmonary disease.   DISCHARGE DIAGNOSES: 1.  Acute exacerbation of chronic obstructive pulmonary disease with acute on chronic respiratory failure.  2.  Hemoptysis possibly secondary to vascular epiglottic lesion or nose bleed.  3.  Chronic hyponatremia.  4.  Hypothyroidism.  5.  Leukopenia, resolved.   CONSULTATIONS: Dr. Andee PolesVaught from ENT. Dr. Lady GaryFath.   LABORATORIES AT DISCHARGE: White blood cells 8.5, hemoglobin 11, hematocrit 33, platelets are 339,000. Sodium 133, potassium 4.3, chloride 93, bicarbonate 35, BUN 17, creatinine 0.54, glucose 102. Sputum culture is gram-negative rods.   HOSPITAL COURSE: This is a 69 year old female who was admitted on 02/25/2014 from Dr. Milta DeitersKhan's office for hemoptysis. For further details, please refer to Dr. Arlys JohnVaickute's H and P.  1.  Hemoptysis. Appreciate ENT evaluation. The patient was seen by Dr. Andee PolesVaught, our ear, nose, throat physician. She underwent a transnasal flexible laryngoscopy on 02/25/2014 which showed near complete occlusion of the right nostril from recent nosebleed with crusting. The left nostril was clear. There was a 2 mm vascular type lesion on the tip of the epiglottis with no evidence of recent bleeding. The source of bleeding from the day of admission was unclear. Per ENT, it appeared that she probably had a severe nosebleed causing the hemoptysis. She still has a vascular lesion of the epiglottis that could be possibly the source of bleeding from repeated trauma from coughing. He did recommend outpatient followup that has already been [facility]. She had no hemoptysis while here, no nose bleeds.  2.  Acute exacerbation of COPD.   3.   Acute on chronic respiratory failure. The patient was placed on her baseline oxygen,  but also was treated with IV steroids and antibiotics. She is back to her baseline, is improved with this treatment.  4.  Chronic hyponatremia. I suspect this is related to her underlying COPD. Her sodium has remained stable.   5.  Hypothyroidism, on Synthroid.  6.  Leukopenia. The patient's white blood cells have actually improved back to baseline.  7.  History of AICD. Cardiology was consulted regarding her biventricular AICD. As per Dr. Lady GaryFath, there was interrogation of the device and it looks like it is functioning normally, but she only has 3 to 4 months of battery life remaining. She will need the generator to be changed out after this acute illness, and [facility] in about 2 weeks.   PHYSICAL EXAMINATION AT DISCHARGE:  VITAL SIGNS: Temperature 97.8, pulse 72, respirations 18, blood pressure 137/69,  91% on 3 liters.  GENERAL: The patient is alert, oriented, does not appear to be in acute distress.  CARDIOVASCULAR: A 2/6 systolic ejection murmur heard best at the right sternal border. No radiation.  LUNGS: Good air movement. Decreased breath sounds throughout. No crackles, rales, or wheezing were heard. No dullness to percussion.  BACK: No CVA or vertebral tenderness.  ABDOMEN: Bowel sounds are positive. Nontender, nondistended. No hepatosplenomegaly.  EXTREMITIES: No clubbing, cyanosis, or edema.   DISCHARGE MEDICATIONS: 1.  Amitriptyline 100 mg at bedtime.  2.  Clonazepam 2 mg b.i.d.  3.  Advair Diskus 250/50 b.i.d.  4.  Spiriva 18 mcg daily.  5.  Lexapro 20 mg daily.  6.  Coreg 3.125 b.i.d.  7.  ProAir  2 puffs q. 4-6 hours p.r.n.  8.  Lisinopril 2.5 daily.  9.  Digoxin 0.125 mg daily.  10.   Albuterol 3 mL q. 4-6 hours p.r.n. shortness of breath.  11.   Lovaza 2 capsules b.i.d.  12.   Crestor 10 mg at bedtime.  13.   Aspirin 81 mg daily.  14.   Abilify 10 mg daily.  15.   Wellbutrin 300 mg q. 24 hours.  16.   Synthroid 44 mcg daily.   17.   Linzess 290 mcg daily.  18.   Colace 100 mg daily.  19.   Omeprazole 40 mg b.i.d.  20.   KCl 20 mEq daily.  21.   Benzonatate 100 mg t.i.d.  22.   Mucinex 600 mg q. 12 hours.  23.   Levaquin 750 mg q. 24 hours x 5 days.   24.   Prednisone taper starting at 50 mg, taper x 10 mg every 2 days.  25.   Fentanyl 50 mcg q. 72 hours.  26.   NAC 3 mL b.i.d. by nebulizer.  27.   Percocet 7.5/325 q. 4 hours p.r.n. pain.  28.   Ketoconazole topical as needed.   DISCHARGE DIET: Low sodium diet with Ensure 3 times a day.   DISCHARGE HOME HEALTH: With physical therapy, nurse, nurse aide for assessment in gait.   DISCHARGE OXYGEN: 3 liters nasal cannula.   DISCHARGE FOLLOWUP: The patient will follow up with Dr. Welton Flakes next week and [facility] cardiology in 2 weeks.    TIME SPENT: 45 minutes. The patient was stable for discharge.    ____________________________ Chalee Hirota P. Juliene Pina, MD spm:at D: 02/27/2014 12:23:54 ET T: 02/27/2014 13:46:33 ET JOB#: [id number]  cc: Amalie Koran P. Juliene Pina, MD, <Dictator> Yevonne Pax, MD [facility] Cardiology Jalie Eiland P Demba Nigh MD ELECTRONICALLY SIGNED 02/27/2014 21:25

## 2014-09-11 NOTE — Consult Note (Signed)
PATIENT NAME:  Yolanda Price, Yolanda G MR#:  [id number] DATE OF BIRTH:  Jul 21, 1945  DATE OF CONSULTATION:  02/25/2014  REFERRING PHYSICIAN:   CONSULTING PHYSICIAN:  Kyung Ruddreighton C. Aliyha Fornes, MD  CONSULTING PHYSICIAN: [name].    REASON FOR CONSULTATION: Hemoptysis.   HISTORY OF PRESENT ILLNESS: The patient is a 69 year old female with history of chronic respiratory failure on 3 liters of O2 continuously at home, also COPD and tobacco abuse. She was at Dr. [name]'s office earlier today and began coughing up blood and she was admitted. I was consult for evaluation of hemoptysis. She reports this happened once earlier in the week and then also again about 8 months ago. Today she noted that she was having clots for which she spit up. She denies any significant change. 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 Marion DownerScott Bennett, vascular lesion recurred and her hemostasis has persisted. Dr. Willeen CassBennett attempted to get clearance from pulmonology and/or cardiology and the patient was deemed too risky for elective procedure. At that time a recommendation was made for an outpatient evaluation [facility] of which the patient refused.  The patient does report continuation of tobacco as well as chronic oxygen use.   PAST MEDICAL HISTORY:  Chronic obstructive pulmonary disease, chronic respiratory failure on 3 liters of oxygen, hyponatremia, recurrent UTIs, history of melanoma, DVT, MRSA, chronic anxiety, depression, constipation, back pain, atrial fibrillation, diastolic congestive heart failure, stroke, hypothyroidism, hyperlipidemia, coronary artery disease, chronic pain syndrome.   PAST SURGICAL HISTORY: Pacemaker, epiglottic laser laryngoscopy, appendectomy, defibrillator placement, benign breast tumor removed, partial hysterectomy, cholecystectomy.   ALLERGIES: CODEINE.     SOCIAL HISTORY:  The patient is a smoker for 55 years and currently smokes about a pack a day. Denies any recreational drugs or alcohol abuse.   FAMILY HISTORY: Myocardial infarction and breast cancer.   CURRENT MEDICATIONS: Include Tylenol, Percocet, albuterol, amitriptyline, Abilify, Ecotrin, benzoate, bupropion, carvedilol, clonazepam, digoxin, Colace, Lexapro, fentanyl, Advair Diskus, guaifenesin, ketoconazole, Levaquin, levothyroxine, lisinopril, Solu-Medrol, nicotine inhaler, nicotine patch, omega-3 fatty acids, Zofran, pantoprazole, Phenergan, Crestor, Senokot, Spiriva.   VITAL SIGNS: Temperature is 98, pulse 101, respirations 20, blood pressure 132/74, pulse oximetry is 94% on 3 liters.   PHYSICAL EXAMINATION:  GENERAL: She is a well-nourished, thin female lying supine in bed, has O2 nasal cannula in place.  HEENT:  Ears,  EACs clear. Nose, real significant amount of dried crusted blood in the right completely filling her right nasal cavity and left is clear, but dry mucus membranes. Oral cavity and oropharynx reveals edentulous and there is no mucopus or polyps coming from her nasal cavity and nasopharynx.  NECK: Supple. No lymphadenopathy or thyromegaly.   PROCEDURE: There is a detailed operative report of transnasal flexible laryngoscopy. This revealed a significant amount of previous old blood in the patient's right nasal cavity as well as some epistaxis and a small 2 mm vascular lesion at the tip of the epiglottis.   IMPRESSION: Hemoptysis with questionable source of epistaxis versus epiglottic lesion. She has blood in her right nasal cavity and a large excoriation, but no blood anywhere else on my examination today.  I discussed the patient with Dr. Willeen CassBennett who has taken her for surgery before in the past. At the last visit he had recommended evaluation by [facility] given the patient's high risk for surgery as well as denial for surgical  clearance for an elective procedure for  pulmonology as well as cardiology given the patient's significant comorbidities.  The patient at that time had declined. 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 Please contact us if you have any other questions or concerns.     ____________________________ Kyung Rudd, MD ccv:bu D: 02/25/2014 18:18:20 ET T: 02/25/2014 18:32:57 ET JOB#: [id number]  cc: Kyung Rudd, MD, <Dictator> Kyung Rudd MD ELECTRONICALLY SIGNED 03/10/2014 7:38

## 2014-09-11 NOTE — Op Note (Signed)
PATIENT NAME:  Yolanda AltesBROWN, Yolanda G MR#:  [id number] DATE OF BIRTH:  July 25, 1945  DATE OF PROCEDURE:  02/25/2014  PREPROCEDURE DIAGNOSES:  1. Hemoptysis.  2. History of tobacco abuse.  POSTPROCEDURE DIAGNOSES:  1. Hemoptysis.  2. History of tobacco abuse.   PROCEDURE PERFORMED: Transnasal flexible laryngoscopy.   SURGEON: Kyung Ruddreighton C. Brack Shaddock, MD  DESCRIPTION OF PROCEDURE: After verbal consent was obtained, the patient's nasal cavity was anesthetized with topical phenylephrine and lidocaine and transnasal flexible laryngoscopy was employed. This demonstrated a significant amount of right-sided congestion and excoriation as well as a bloody mucus in her right nasal cavity as well as an exploration from nasal cannula. The patient's left nostril was clear and dry. The scope was passed through the left nostril. This demonstrated a dry, desiccated adenoid pad. The patient also had a small 2 mm vascular lesion at the tip of the epiglottis, but no evidence of any recent bleeding on my exam. Larynx is clear. True vocal folds are mobile bilaterally. Vallecula was clear. The pyriform sinuses were clear. Postcricoid and interarytenoid area reveals some dry, thickened secretions but no evidence of blood.   IMPRESSION: History of recent epistaxis as well as epiglottic tip vascular lesion.     ____________________________ Kyung Ruddreighton C. Yuridia Couts, MD ccv:TT D: 02/26/2014 17:30:44 ET T: 02/26/2014 18:37:06 ET JOB#: [id number]  cc: Kyung Ruddreighton C. Jaystin Mcgarvey, MD, <Dictator> Kyung RuddREIGHTON C Brissia Delisa MD ELECTRONICALLY SIGNED 03/10/2014 7:38

## 2014-09-11 NOTE — H&P (Signed)
PATIENT NAME:  Yolanda Price, Yolanda Price MR#:  [id number] DATE OF BIRTH:  May 09, 1946  DATE OF ADMISSION:  10/21/2013  PRIMARY CARE PHYSICIAN:  Dr. Carlynn Purl.  REFERRING PHYSICIAN:  Dr. Carollee Massed.   CHIEF COMPLAINT:  Worsening shortness of breath, cough, wheezing today.   HISTORY OF PRESENT ILLNESS:  A 69 year old Caucasian female with a history of COPD on home oxygen, presented to the ED with the above chief complaint.  The patient is alert, awake, oriented, in no acute distress.  According to the patient and the patient's daughter the patient has a chronic cough and shortness of breath on home oxygen 2 liters, but the symptoms have been worsening for the past one day with shortness of breath, cough, and wheezing.  The patient denies any fever or chills.  No orthopnea, nocturnal dyspnea.  No leg edema, but the patient fell three days ago, has left rib pain and bruises on the extremities.  The patient's O2 saturation was low in the 80s in the ED, was placed on BiPAP.  In addition, the patient's blood pressure was low at 86/48, was given normal saline bolus.  The patient had a recent fall twice and has left chest wall pain.   PAST MEDICAL HISTORY:  COPD on home oxygen, chronic hyponatremia, recurrent UTI, melanoma, hypertension, history of DVT, MRSA pneumonia, chronic anxiety, depression, chronic constipation, chronic back pain, history of CVA, hypothyroidism, hyperlipidemia, CAD, chronic pain syndrome, chronic A. Fib, not on anticoagulation, status post pacemaker.   PAST SURGICAL HISTORY:  Epiglottic lesion laser surgery by ENT, AICD placement, appendectomy, cholecystectomy, benign breast tumor removal, partial hysterectomy.   SOCIAL HISTORY:  Quit smoking last year.  No alcohol drinking or illicit drugs.  Living with her daughter.   FAMILY HISTORY:  Father died of MI.  Mother died of MI.  Sister has bone cancer.    ALLERGIES:  CODEINE.  MEDICATION:   1.  Synthroid 1 tablet by mouth daily.  2.   Sulfamethoxazole, trimethoprim 400 mg/80 mg by mouth 1 tablet once a day.  3.  Spiriva 18 mcg by mouth daily.  4.  ProAir HFA CFC 90 mcg two puffs every 4 to 6 hours as needed for shortness of breath.  5.  Potassium 20 mEq by mouth daily.  6.  Percocet 10/325 mg by mouth 1 tablet every six hours.  7.  Omeprazole 40 mg by mouth twice daily.  8.  Mucinex 600 mg by mouth q. 12 hours.  9.  Lovaza 1000 mg by mouth 2 capsules twice daily.  10.  Lisinopril 2.5 mg by mouth daily.   11.  Linzess 290 mcg by mouth once a day.  12.  Lexapro 20 mg by mouth daily in the morning. 13.  Ketoconazole topical 2% apply topically to affected area as needed once a day.  14.  Flomax 0.4 mg by mouth once a day as needed.  15.  Digoxin 125 mcg by mouth daily.  16.  Crestor 10 mg by mouth at bedtime.  17.  Colace 100 mg by mouth daily.  18.  Clonazepam 2 mg by mouth twice daily.  19.  Coreg 3.125 mg by mouth twice daily.  20.  Bupropion 300 mg by mouth daily.  21.  Benzonatate 100 mg by mouth 3 times a day as needed.  22.  Aspirin 81 mg by mouth daily.  23.  Amitriptyline 100 mg by mouth once a day in the evening.  24.  Albuterol 2.5 mg per 3 mL, 3 mL  inhaled every four hours as needed.  25.  Advair 250 mcg/50 mcg powder 1 puff twice a day.  26.  Abilify 10 mg by mouth daily.   REVIEW OF SYSTEMS:  CONSTITUTIONAL:  The patient denies any fever or chills.  No headache or dizziness, but has weakness.   EYES:  No double vision, blurred vision.  EARS, NOSE, THROAT:  No postnasal drip, slurred speech or dysphagia.  CARDIOVASCULAR:  Has chest pain on the left side in the left lower part.  No palpitations, no orthopnea, no nocturnal dyspnea.  No leg edema.  PULMONARY:  Positive for cough, sputum, shortness of breath, wheezing.  No hematemesis.  GASTROINTESTINAL:  No abdominal pain, nausea, vomiting, diarrhea.  No melena or bloody stool.  GENITOURINARY:  No dysuria, hematuria, or incontinence.  SKIN:  No rash or  jaundice, but has a lot of bruises due to fall.  NEUROLOGY:  No syncope, loss of consciousness, or seizure.  HEMATOLOGY:  Has easy bruising, but no bleeding.  ENDOCRINE:  No polyuria, polydipsia, heat or cold intolerance.   PHYSICAL EXAMINATION: VITAL SIGNS:  Temperature 98, blood pressure was 86/48, after bolus of normal saline the patient's blood pressure increased to 103/46, pulse 99, O2 saturation 94% on BiPAP.  GENERAL:  The patient is awake, alert, oriented, in no acute distress.  HEENT:  Pupils round, equal and reactive to light and accommodation.  Dry oral mucosa.  Clear oropharynx.  NECK:  Supple.  No JVD or carotid bruits.  No lymphadenopathy.  No thyromegaly.  CARDIOVASCULAR:  S1 and S2.  Regular rate, rhythm.  No murmurs, gallops.  PULMONARY:  Bilateral air entry.  No wheezing or rales.  No use of accessory muscles.  Bilateral air entry.  Expiratory wheezing with crackles on both sides.  No use of accessory muscle to breathe.  ABDOMEN:  Soft.  No distention or tenderness.  No organomegaly.  Bowel sounds present.  EXTREMITIES:  No edema, clubbing or cyanosis.  No calf tenderness.  Bilateral pedal pulses present.  SKIN:  No rash or jaundice, but has a lot of bruises on both upper and lower extremities, especially upper extremity with some skin tears.  NEUROLOGY:  A and O x 3.  No focal deficit.  Power 3 to 4 out of 5.  Sensation intact.   LABORATORY, DIAGNOSTIC AND RADIOLOGICAL DATA:  CAT scan of chest showed significant bilateral lower lobe atelectasis with minimal left pleural effusion, advanced emphysema.  There are nondisplaced acute-appearing fracture of left 9th and 10th rib.  No pneumothorax.  Changes of cirrhosis.  Urinalysis is negative.  The ABG showed pH of 7.24, pCO2 of 69, pO2 of 53 with FIO2 of 36.  Chest x-ray:  No acute findings.  Glucose 97, BUN 31, creatinine 1.32, sodium 127, potassium of 5.4, chloride 91, bicarb of 29.  WBC 15.6, hemoglobin 10.5, platelets 407.   Troponin less than 0.02.  Digoxin level 1.5.  EKG showed atrial sensed ventricular paced rhythm at 104 bpm.   IMPRESSIONS: 1.  Acute on chronic respiratory failure.  2.  Chronic obstructive pulmonary disease exacerbation.  3.  Hypotension.  4.  Dehydration.  5.  Hyponatremia.  6.  Hyperkalemia.  7.  Leukocytosis.  8.  Left 9th and 10th rib fractures.  9.  Anemia.  10.  History of methicillin resistant Staphylococcus aureus pneumonia.  11.  Chronic atrial fibrillation.   PLAN OF TREATMENT: 1.  The patient will be admitted to medical floor with telemonitor.  We will continue  BiPAP.  Start Solu-Medrol, Xopenex.  Continue Advair, Spiriva, Zithromax.  The patient's daughter mentioned the patient has a history of methicillin resistant Staphylococcus aureus pneumonia.  Since the patient has leukocytosis, we will follow up with Dr. Sampson GoonFitzgerald and follow up complete blood count and continue Zithromax.  The patient was given doxycycline in the Emergency Department.  I will defer to Dr. Sampson GoonFitzgerald for antibiotic treatment.  2.  For dehydration and hyponatremia, we will give normal saline and follow up basic metabolic panel.  3.  Hypotension, improved after normal saline bolus.  We will continue normal saline intravenous.  4.  For hyperkalemia, we will hold potassium supplement and give intravenous rehydration.  Follow up a potassium level.  5.  I discussed the patient's critical condition with the patient and the patient's daughters.   TIME SPENT:  About 63 minutes.   CODE STATUS:  THE PATIENT WANTS FULL CODE.   ____________________________ Shaune PollackQing Rosemaria Inabinet, MD qc:ea D: 10/21/2013 21:43:09 ET T: 10/21/2013 23:00:34 ET JOB#: [id number]  cc: Shaune PollackQing Rosendo Couser, MD, <Dictator> Shaune PollackQING Verginia Toohey MD ELECTRONICALLY SIGNED 10/22/2013 15:50

## 2014-09-11 NOTE — Consult Note (Signed)
PATIENT NAME:  Yolanda Price, Yolanda Price MR#:  [id number] DATE OF BIRTH:  January 11, 1946  INFECTIOUS DISEASE CONSULTATION  DATE OF CONSULTATION:  10/22/2013  REFERRING PHYSICIAN:  Dr. Imogene Burn CONSULTING PHYSICIAN:  Stann Mainland. Sampson Goon, MD  REASON FOR CONSULTATION: Pneumonia, respiratory failure and history of prior MRSA.   HISTORY OF PRESENT ILLNESS: This is a 69 year old female I had originally seen in August 2014 for CRE pseudomonal pneumonia and MRSA pneumonia. She has a history of advanced COPD with frequent exacerbations on home O2, as well as a history of prior DVT, CHF, AICD placement, hypothyroidism and atrial fibrillation. She was doing relatively well at home on suppressive Bactrim until 5 days ago when she fell. She injured her chest at that time. She came to the hospital yesterday with complaints of worsening chest pain, cough and shortness of breath and wheezing. In the ER, she was found to be hypoxic and hypotensive and had a white count of 15. The patient has been admitted, started on BiPAP and is clinically improving somewhat. She has been afebrile and has been having an increased sputum production and had large amounts of sputum this morning. She was found to have a rib fracture on her CT of her chest. We are consulted for further management.   PAST MEDICAL HISTORY: 1.  Advanced COPD with frequent exacerbations and admissions. On home O2, chronic hyponatremia, recurrent UTIs, melanoma, hypertension, history of DVT, history of prior MRSA and CRE pseudomonal pneumonia.  2.  Chronic anxiety, depression, constipation, low back pain, CVA, hypothyroidism, hyperlipidemia, CAD, chronic pain syndrome, chronic afib, status post permanent pacemaker.   PAST SURGICAL HISTORY: Epiglottic lesion laser surgery by ENT, AICD placement, appendectomy, cholecystectomy, benign breast tumor removal, partial hysterectomy.   SOCIAL HISTORY: The patient lives with her daughter. She quit smoking after many years 1 year ago,  does not drink or smoke.   FAMILY HISTORY: Father with MI.  Mother with MI.  Sister with bone cancer.   ALLERGIES: CODEINE.   MEDICATIONS: As an outpatient have been reviewed. It notes she was on Bactrim, once a day suppressive therapy.  REVIEW OF SYSTEMS: Eleven systems were reviewed and negative except as per HPI.   PHYSICAL EXAMINATION: VITAL SIGNS: Temperature 98, pulse 88 to 102, blood pressure 115/68, respirations 22, sat 95% on BiPAP.  GENERAL: She is somewhat confused, lying sideways in bed. Her daughter is in the room with her.  Her pupils are equal, round and reactive to light and accommodation. Extraocular movements are intact. Sclerae are anicteric. Oropharynx: Mucous membranes are dry.  NECK: Supple.  HEART: Tachy but regular.  LUNGS: Showed very poor breath sounds bilaterally. She has difficulty taking a deep breath due to pain.  ABDOMEN: Soft, nontender, nondistended. No hepatosplenomegaly.  EXTREMITIES: Trace edema bilaterally.  SKIN: Multiple bruises. NEUROLOGIC: She is somewhat confused but is quite talkative and emotionally labile. She is able to move all 4 extremities.   DIAGNOSTIC DATA: Prior culture data is reviewed. Of note:  In the past, she has had pseudomonas grow from sputum in 12/2012 that was sensitive to Cipro, gent and ceftazidime. She also had MRSA in prior sputum cultures as an inpatient. Outpatient labs will have to be reviewed from my outpatient system. Of note, her MRSA in the past was resistant to Bactrim and levofloxacin, ciprofloxacin and oxacillin but was sensitive to vancomycin, linezolid, tigecycline.   White blood count on admission was 15,000; currently it is 10.6. Hemoglobin 9.2, platelets 310, blood cultures x 2 are negative. Urinalysis had  2 white cells. ABG showed 7.24/69/53 nasal cannula. Creatinine was 1.32, now it is 0.6. BUN 17. Sodium 130, bicarb 29.  Chest x-ray showed no acute cardiopulmonary disease. CT of her chest, abdomen and pelvis  showed mild coronary artery calcifications, shotty right subcarinal lymph node 13 mm. No hilar mass or enlarged lymph nodes. There is bilateral lower lobe atelectasis and advanced COPD and chronic interstitial thickening. No pulmonary edema. There is minimal pleural fluid on the left. No pneumothorax. There are fractures of T12, L1 and L2.  These are stable. There is a mild fracture of T4. There are fractures of the posterior lateral left 10th and 11th ribs and latter left 9th ribs which appears acute.   IMPRESSION: A 69 year old, chronic obstructive pulmonary disease, chronic recurrent bronchitis and pneumonia with methicillin-resistant Staphylococcus aureus admitted after a fall with rib fractures, chest pain. She had a leukocytosis of 15, but is afebrile. She does not have an impressive pneumonia on chest x-ray; however, is making more sputum.   RECOMMENDATIONS: 1.  The patient reports sending a sputum culture this morning.  2.  Recommend holding on antibiotics at this point. Blood cultures are pending and sputum cultures are pending. If she decompensates, I would start her on vancomycin.  3.  Continue further treatment of the rib fractures and chronic obstructive pulmonary disease per primary team.  Thank you for the consult. I will be glad to follow with you.    ____________________________ Stann Mainlandavid P. Sampson GoonFitzgerald, MD dpf:ce D: 10/22/2013 16:48:41 ET T: 10/22/2013 17:19:30 ET JOB#: [id number]  cc: Stann Mainlandavid P. Sampson GoonFitzgerald, MD, <Dictator> Kevonte Vanecek Sampson GoonFITZGERALD MD ELECTRONICALLY SIGNED 10/23/2013 16:44

## 2014-09-11 NOTE — Consult Note (Signed)
Brief Consult Note: Diagnosis: hemoptysis.   Patient was seen by consultant.   Consult note dictated.   Recommend further assessment or treatment.   Comments: [age] y.o. female with multi-year history of intermittent hemoptysis.  Severe COPD and on home oxygen.  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  Per patient, recommended further evaluation at [facility] but patient previously declined.  PE- Gen- NAD, A&O Nose- right side 95% occluded with blood and crusting, left side clear but dry OC/OP- edentulous maxilla, no bleeding Neck- supple  Trans-nasal laryngoscopy- near complete occlusion of right nostril from recent nose bleed with crusting, left clear, oropharynx clear, base of tongue clear, larynx clear, small 2mm vascular type lesion of tip of epiglottis with no evidence of recent bleeding.  Impression:  Epistaxis with vascular lesion on epiglottis  Plan: 1)  Unclear of source of bleeding from today.  On examination it appears more likely she had a severe nose bleed as she has near complete occlusion of the right nostril from old blood/crusts from excoriation from chronic O2 use. 2)  Vascular lesion of epiglottis- possibly source of bleeding from repeated trauma from coughing.  Recommend agressive humidification, reduction of cough, and limitation of throat clearing and smoking. 3)  Discussed with Dr. Willeen CassBennett.  Patient was deemed too risky previously from cardiac/pulm standpoint and was not cleared for elective surgery.  At that time a recommendation for referral to [facility] was made and patient refused.  I again recommend outpatient referral to [facility] for evaluation for resection/albation of recurrent epiglottic vascular lesion.  Electronic Signatures: Flossie DibbleVaught, Zidane Renner Charles (MD)  (Signed 08-Oct-15 18:09)  Authored: Brief Consult Note   Last Updated: 08-Oct-15 18:09 by Flossie DibbleVaught, Nicholas Ossa Charles (MD)

## 2014-09-11 NOTE — H&P (Signed)
PATIENT NAME:  Yolanda Price, Yolanda Price MR#:  [id number] DATE OF BIRTH:  17-Dec-1945  DATE OF ADMISSION:  02/25/2014  PRIMARY CARE PHYSICIAN: Dr. Carlynn PurlSowles.   PULMONOLOGIST: Dr. Freda MunroSaadat Khan.  HISTORY OF PRESENT ILLNESS: The patient is a 69 year old Caucasian female with past medical history significant for history of chronic respiratory failure, on 2-3 liters of oxygen through nasal cannula at home continuously. Also COPD and ongoing tobacco abuse, who presents to the hospital from Dr. [name]'s office as a direct admit because of hemoptysis. Apparently, the patient was for followup in COPD in Dr. [name]'s office when she started having some tightness in the chest and started coughing up blood. 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 Willeen CassBennett. She did not have blood for awhile in her sputum; however, approximately 8 months ago or so she started having intermittent bleeding, which she noted in her sputum. Today it was with clots. Because of this bleeding, she was seen by ENT and they recommended some other intervention; however, she was not ever cleared by pulmonologist and she did not have this procedure done. Now she comes back because of significant bleeding. She is not sure how much blood she lost but she stated that she bled a few clots, significant amount of bleeding. Unfortunately it is unclear now how much she lost.  PAST MEDICAL HISTORY: Significant for history of history of chronic obstructive pulmonary disease, chronic respiratory failure on 2-3 liters of oxygen through nasal cannula at baseline, a history of chronic hyponatremia, recurrent urinary tract infections, history of melanoma, history of pacemaker placement, hypertension, DVT in the past, a history of MRSA as well as pseudomonas pneumonia, chronic anxiety, depression, chronic constipation, chronic back pains, atrial fibrillation not on anticoagulation, history of diastolic congestive  heart failure, chronic diastolic congestive heart failure, stroke, hypothyroidism, hyperlipidemia, coronary artery disease, and chronic pain syndrome.   PAST SURGICAL HISTORY: Epiglottic lesion, laser surgery by ENT, Dr. Willeen CassBennett, approximately 2 years ago per the patient, automatic implantable cardiac defibrillator placement,  appendectomy, cholecystectomy, benign breast tumor removal as well as partial hysterectomy.   ALLERGIES: CODEINE, HOWEVER, ABLE TO TOLERATE MORPHINE AS WELL AS DILAUDID AND SHE IS ON PERCOCET AT HOME.   SOCIAL HISTORY: The patient is a former smoker, smoked for a prolonged period of time, for 55 years, quit in March 2014. Still smokes, quit for some period of time and now she is smoking approximately 15 cigarettes a day. Has smoked 2 packs of cigarettes in the past. No alcohol abuse. No recreational drug abuse. No coffee, tea, sodas, or other caffeinated drinks or beverages. Unemployed.   FAMILY HISTORY: Myocardial infarction, breast cancer in family members.   MEDICATIONS: According to medical records, the patient is on Abilify 10 mg p.o. daily, Acetylcysteine 3 mL twice daily, nebulizers, Advair Diskus 250/50, 1 puff twice daily, albuterol 2.5 mg in 3 mL inhalation solution every 4-6 hours as needed, amitriptyline 100 mg p.o. daily, aspirin 81 mg p.o. daily, benzonatate 100 mg 3 times daily as needed, bupropion extended-release 300 mg p.o. daily, carvedilol 3.125 mg p.o. twice daily, clonazepam 2 mg p.o. twice daily, Colace 100 mg p.o. once daily, Crestor 10 mg p.o. at bedtime, digoxin 125 mg p.o. daily, Fentanyl 50 mcg transdermal film every 3 days, ketoconazole topical cream 2% to affected area once daily as needed, levofloxacin 750 mg p.o. every 24 hours, Lexapro 10 mg p.o. daily, Linzess 290 mcg oral capsule once daily, lisinopril  2.5 mg p.o. daily, Lovaza 1 gram 2 tablets once daily which would be 2 grams twice daily, Mucinex 600 mg p.o. daily, omeprazole 40 mg p.o. twice  daily, Percocet 7.5/325 mg 1 tablet every 4 hours as needed, potassium chloride 20 mg once daily, prednisone 10 mg, prednisone taper apparently. I am not sure if patient is still on prednisone or Levaquin at present. ProAir HFA 2 puffs every 4-6 hours, Spiriva 1 inhalation once daily, and Synthroid 44 mcg p.o. daily.   PAST SURGICAL HISTORY: Appendectomy in a younger age, back surgery in February 2015, gallbladder surgery as well as parotid gland surgery in 2012, epiglottic lesion laser surgery by ENT as mentioned above, automatic implantable cardiac defibrillator.   REVIEW OF SYSTEMS:  GENERAL: Positive for hemoptysis, which is going on for the past 8 months, worsening over the past day. She seemed to be doing well in regards to her weight. Used to be up and down as high as 60 pounds but now she is being diuresed and she did not have any recurrences over the past 6-8 months, sinus congestion as well as postnasal drip, some cough as well as wheezes and dyspnea, which seemed to be chronic although worsening over the past few days, a week that she has been expectorating thick and greenish-grayish-looking phlegm which is worse and new since approximately 1 week ago. She is on 2 or 3 liters of oxygen nasal cannula keeping her oxygen saturation at around 88% to 90%. She also admitted of having some beeping in her defibrillator, is being followed by Dr. Darrold Junker and apparently device should be interrogated with Dr. Darrold Junker in the nearest future, admits intermittent dysuria for which she was diagnosed with interstitial cystitis, also intermittent incontinence. Has stress incontinence for which she uses pads as well as Depends. Denies any fevers, chills, fatigue, weakness, pains except to her lower back. No weight loss or gain.  EYES: Denies any blurry vision, double vision, or glaucoma or cataracts.  EARS, NOSE, THROAT: Denies any tinnitus, allergies, epistaxis, sinus pain, dentures, difficulty swallowing.   RESPIRATORY: Admits to hemoptysis, admits to asthma and COPD.  CARDIOVASCULAR: Denies chest pains, orthopnea, edema. Has no palpitations or syncope.  GASTROINTESTINAL: Denies any nausea, diarrhea, or constipation.  GENITOURINARY: Denies hematuria, or frequency, or incontinence.  ENDOCRINOLOGY: Denies any polydipsia, nocturia, thyroid problems, heat or cold intolerance or thirst.  HEMATOLOGIC: Denies anemia, easy bruising or bleeding, swollen glands.  SKIN: Denies acne, rash, lesions or change in moles.  MUSCULOSKELETAL: Denies arthritis, cramps, swelling. NEUROLOGIC: Denies numbness, epilepsy or tremor.  PSYCHIATRIC: Denies anxiety or insomnia.   PHYSICAL EXAMINATION:  VITALS SIGNS: On arrival to the hospital, temperature was 98, pulse was 101, respirations were 20, blood pressure 132/74, saturation was 94% liters on 3 liters of oxygen through nasal cannula at rest.  GENERAL: This is a well-developed, well-nourished Caucasian female in no significant distress, lying on stretcher.  HEENT: Her pupils are equal, reactive to light. Extraocular muscles intact, no icterus or conjunctivitis. Has normal hearing. No pharyngeal erythema. Mucosa is moist.  NECK: No masses. Supple, nontender. Thyroid is not enlarged. No adenopathy. No JVD or carotid bruits bilaterally. Full range of motion.  LUNGS: Diminished breath sounds, bilaterally as well as rhonchi and wheezing, bilateral labored inspiration and increased effort to breathe, intermittent especially whenever she moves around. In mild respiratory distress.  CARDIOVASCULAR: S1, S2 appreciated. Irregularly irregular although PMI not lateralized. Chest is nontender to palpation. Pedal pulses 1+. No lower extremity edema, calf tenderness, or  cyanosis was noted.   ABDOMEN: Soft, nontender. Bowel sounds are present. No hepatosplenomegaly or masses were noted.  RECTAL: Deferred.  MUSCLE STRENGTH: Able to move all extremities. No cyanosis, degenerative joint  disease, or kyphosis. Has difficulty sitting up because of significant lower back pains.  SKIN: Did not reveal any rashes, lesions, erythema, nodularity, or joint induration. Some bruising was noted in lower extremities. Skin was warm and dry to palpation. LYMPHATIC: No adenopathy in the cervical region.  NEUROLOGIC: Cranial grossly intact. Sensory is intact. No dysarthria or aphasia. The patient is alert, oriented to time, person and place, cooperative. Memory is good.  PSYCHIATRIC: No significant confusion, agitation or depression noted.  LABORATORY DATA: BUN and creatinine were 8 and 0.52, sodium 135, CO2 level is elevated at 37, otherwise BMP unremarkable. Liver enzymes: Albumin level of 2.5, alkaline phosphatase 207, otherwise liver enzymes were normal. White blood cell count is normal at 6.4, hemoglobin 10.1, platelet count was 302,000. Absolute neutrophil count is normal at 4.5.   EKG is pending.   RADIOLOGIC STUDIES: PA and lateral, 02/25/2014, showed no segmental infiltrates or pulmonary edema, critical amount of interstitial prominence bilaterally without convincing pulmonary edema were noted.   ASSESSMENT AND PLAN:  1. 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 We will get an ENT consultation. We will hold aspirin therapy.  2. Chronic obstructive pulmonary disease exacerbation. With history of MRSA and pseudomonas pneumonia. We will initiate the patient on steroids, also IV broad spectrum antibiotics and follow sputum cultures.  3. Anemia with hemoglobin level above 10; however, it was 8.5 in July, unlikely any significant posthemorrhagic anemia at this point. In fact, the patient looks a little hemoconcentrated; however, we will not initiate any IV fluids due to history of congestive heart failure. We will hold diuretics for today.  4. Hyponatremia, seems to be chronic. We will follow in the morning. We will hold diuretics.  5. Tobacco abuse nicotine  replacement therapy will be initiated while patient is in the hospital.   TIME SPENT: 50 minutes.    ____________________________ Katharina Caper, MD rv:lt D: 02/25/2014 14:46:14 ET T: 02/25/2014 15:31:54 ET JOB#: [id number]  cc: Katharina Caper, MD, <Dictator> Onnie Boer. Carlynn Purl, MD Katharina Caper MD ELECTRONICALLY SIGNED 03/19/2014 12:37

## 2014-09-16 IMAGING — CT CT HEAD WITHOUT CONTRAST
1 series · 16 of 30 positions shown, 20 images · non-contrast
Comparison: none

REASON FOR EXAM: head trauma s/p syncopal episode
COMMENTS:   LMP: Post-Menopausal

PROCEDURE:     CT  - CT HEAD WITHOUT CONTRAST  - February 08, 2013  [DATE]
RESULT:     Comparison:  None
TECHNIQUE: Multiple axial images from the foramen magnum to the vertex were
obtained without IV contrast.

[Series 2: soft tissue · axial · 0.43mm/px · z∈[-21,+114]mm · 16 of 31 slices shown, 20 images]
[im 2/31  brain]
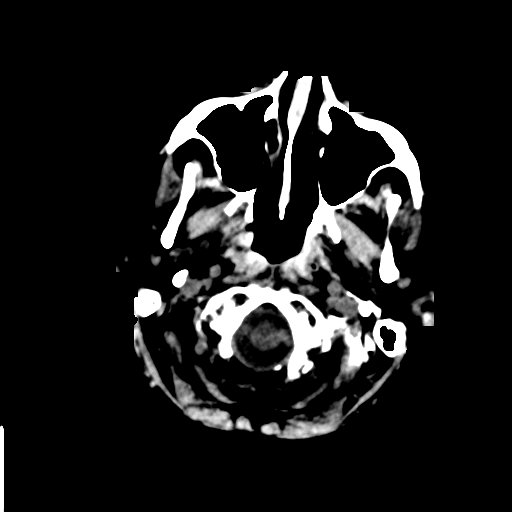
[im 2/31  bone]
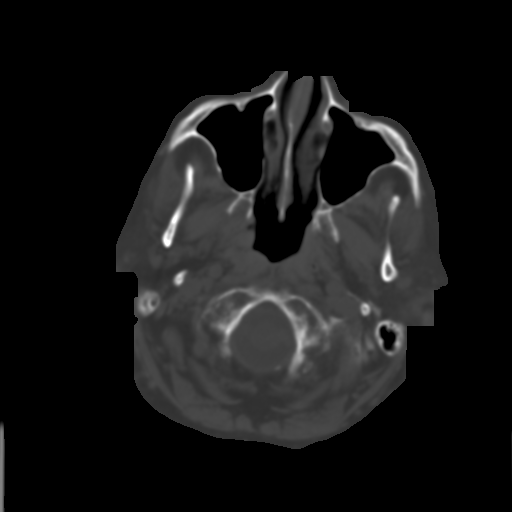
[im 4/31  brain]
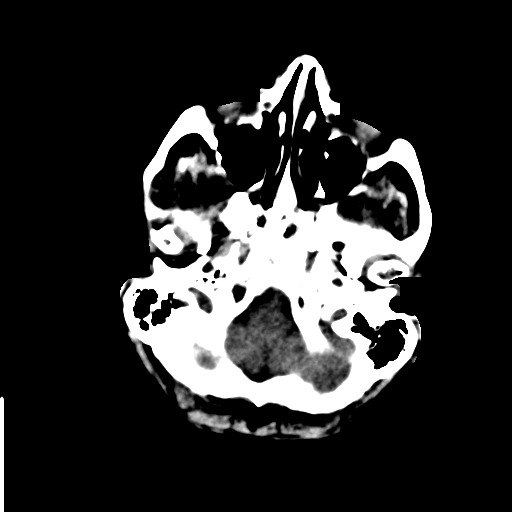
[im 6/31  brain]
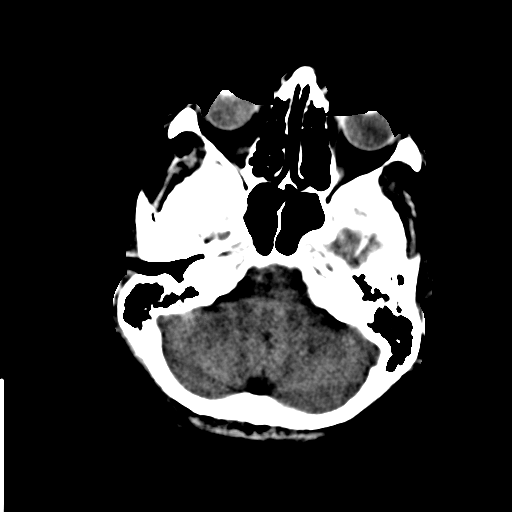
[im 8/31  brain]
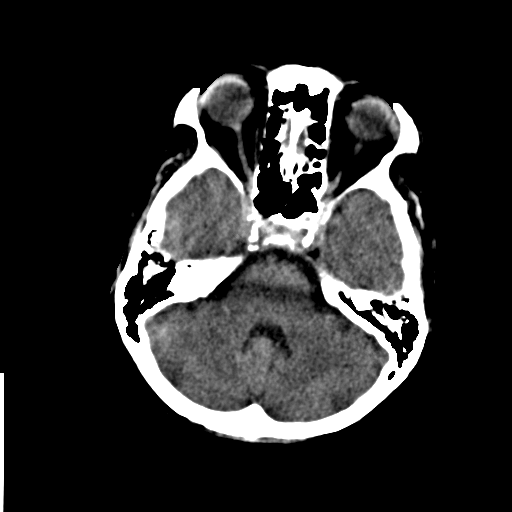
[im 9/31  brain]
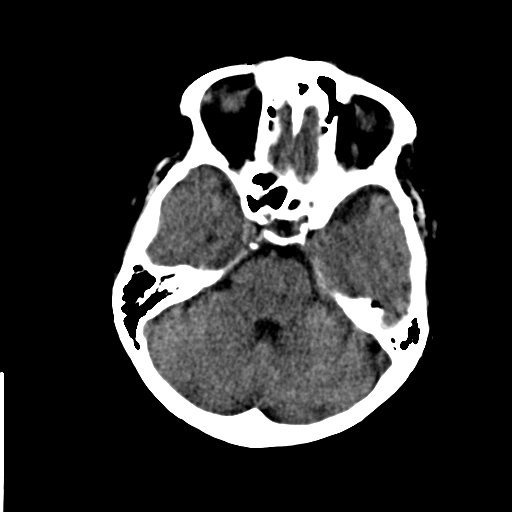
[im 9/31  bone]
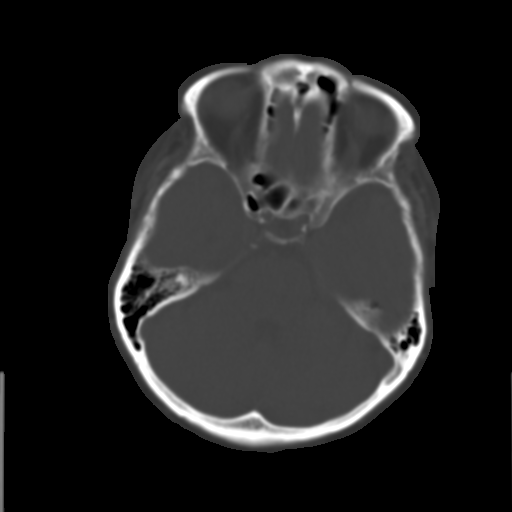
[im 11/31  brain]
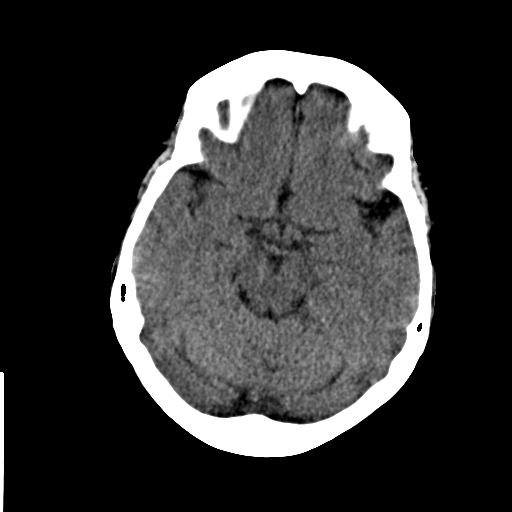
[im 13/31  brain]
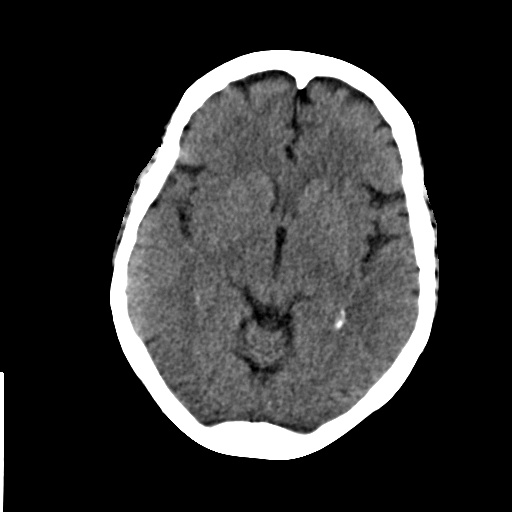
[im 15/31  brain]
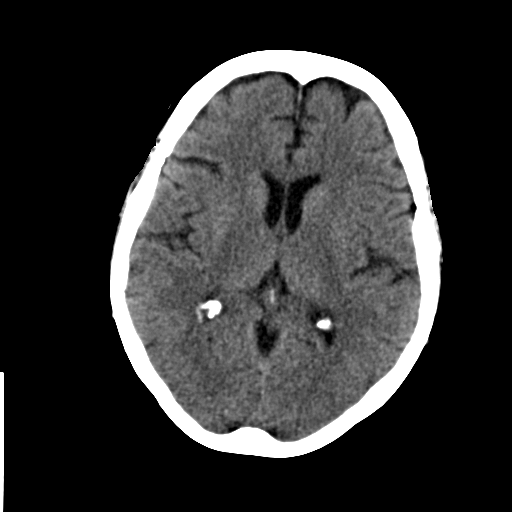
[im 16/31  brain]
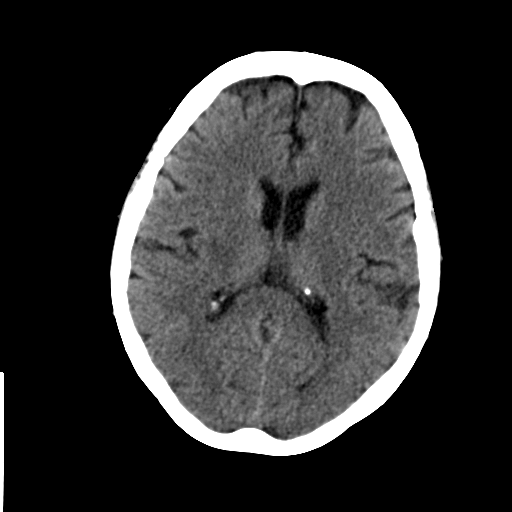
[im 16/31  bone]
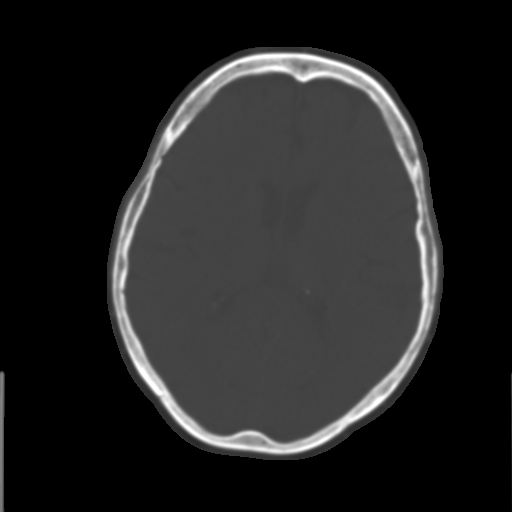
[im 18/31  brain]
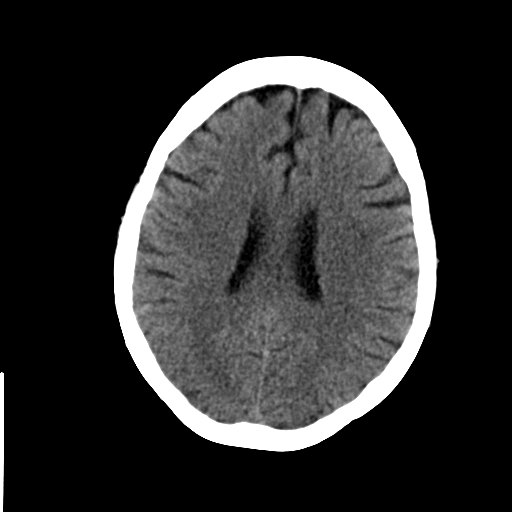
[im 20/31  brain]
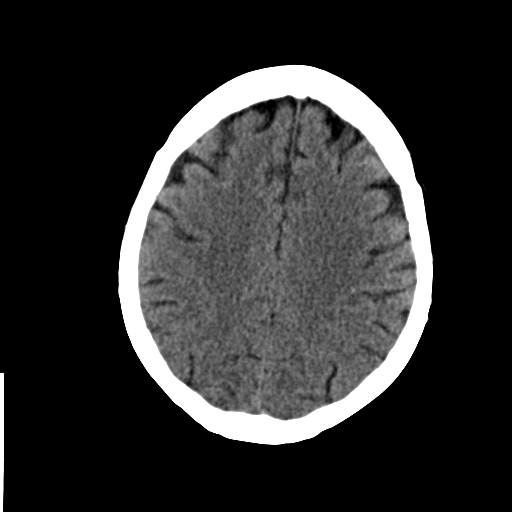
[im 22/31  brain]
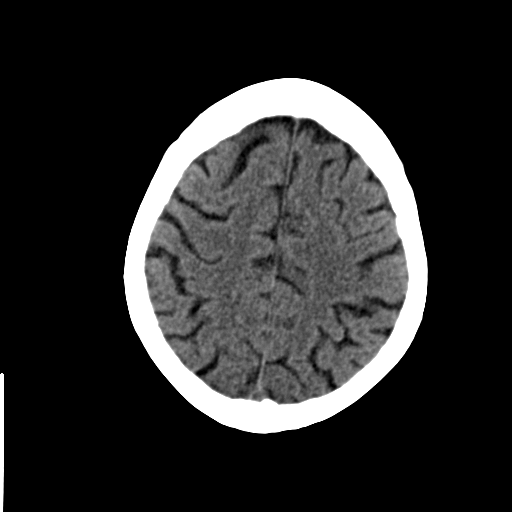
[im 23/31  brain]
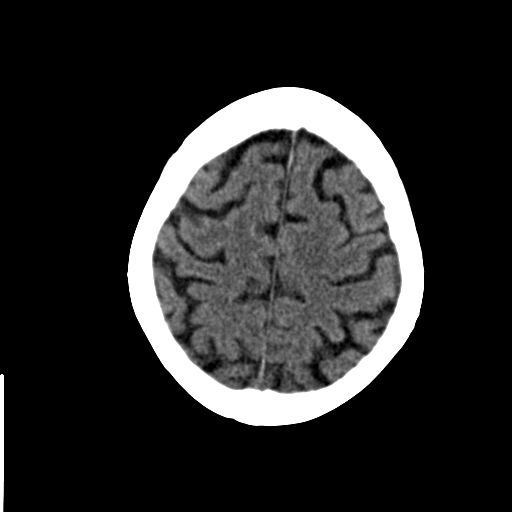
[im 23/31  bone]
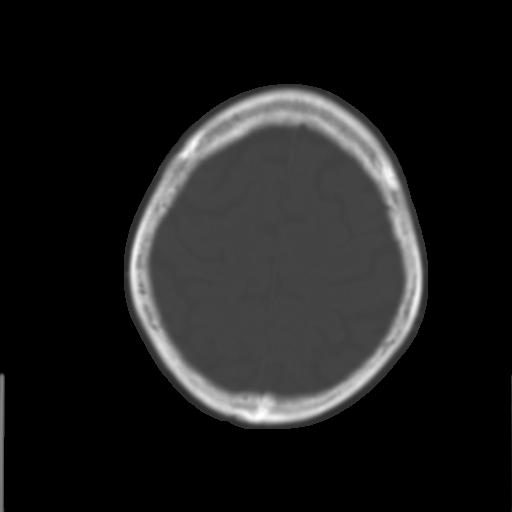
[im 25/31  brain]
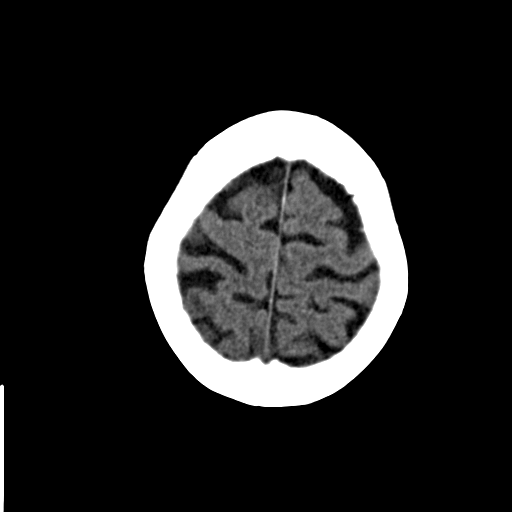
[im 27/31  brain]
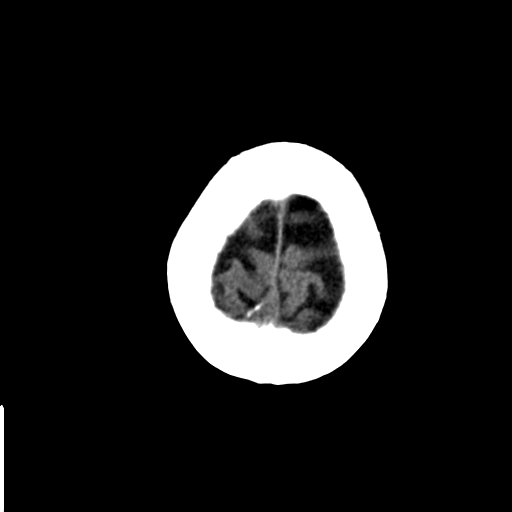
[im 29/31  brain]
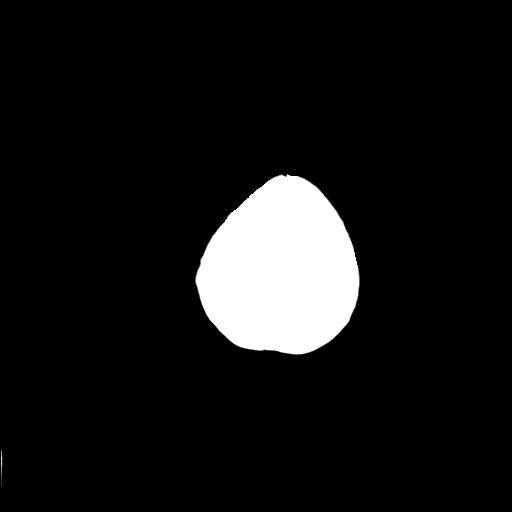

[16 of 30 positions shown; findings below may reference images not displayed]

FINDINGS: There is no evidence of mass effect, midline shift, or extra-axial fluid
collections.  There is no evidence of a space-occupying lesion or
intracranial hemorrhage. There is no evidence of a cortical-based area of
acute infarction.

The ventricles and sulci are appropriate for the patient's age. The basal
cisterns are patent.

Visualized portions of the orbits are unremarkable. The visualized portions
of the paranasal sinuses and mastoid air cells are unremarkable.

The osseous structures are unremarkable.
IMPRESSION: No acute intracranial process.

[REDACTED]

## 2014-09-21 IMAGING — CR DG CHEST 2V
1 series · 2 of 2 positions shown · non-contrast
Comparison: none

REASON FOR EXAM: worsening hypoxia
COMMENTS:

PROCEDURE:     DXR - DXR CHEST PA (OR AP) AND LATERAL  - February 13, 2013  [DATE]
RESULT:
Comparison is made to a prior study dated 02/08/2013.

[Series 1: x chest ap · 0.14mm/px · 2 of 2 slices shown]
[im 1/2]
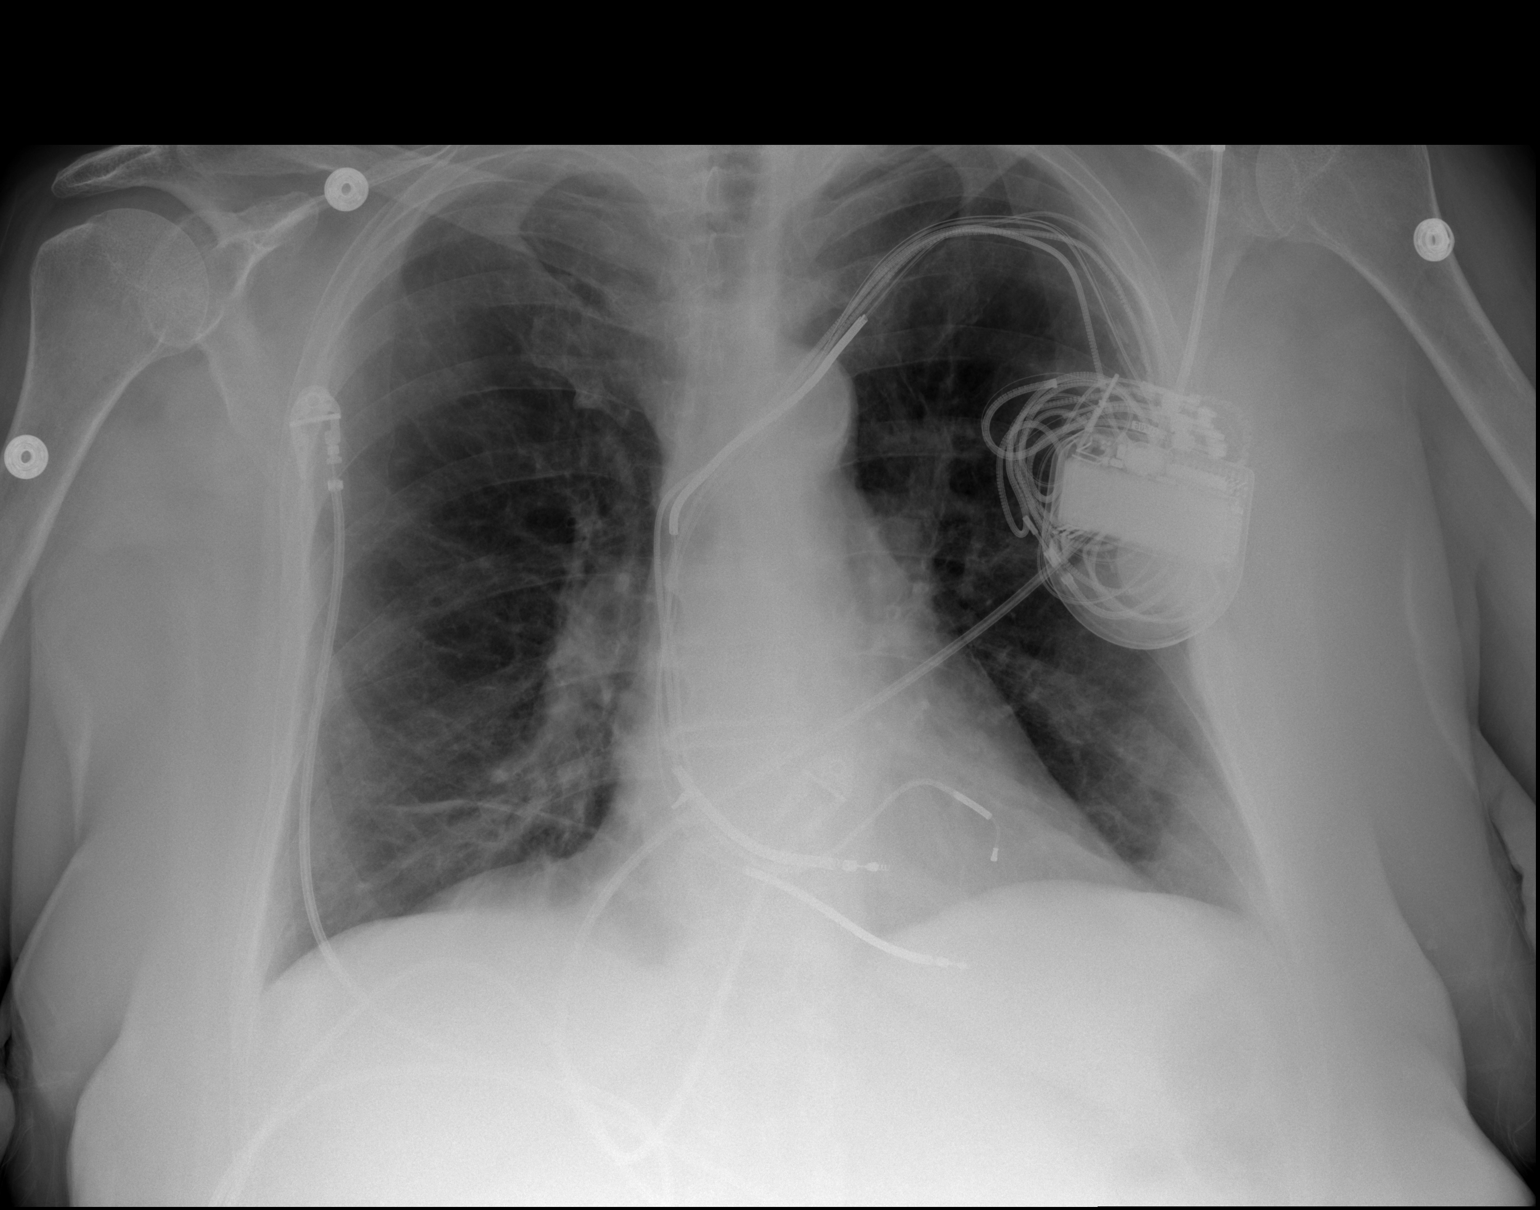
[im 2/2]
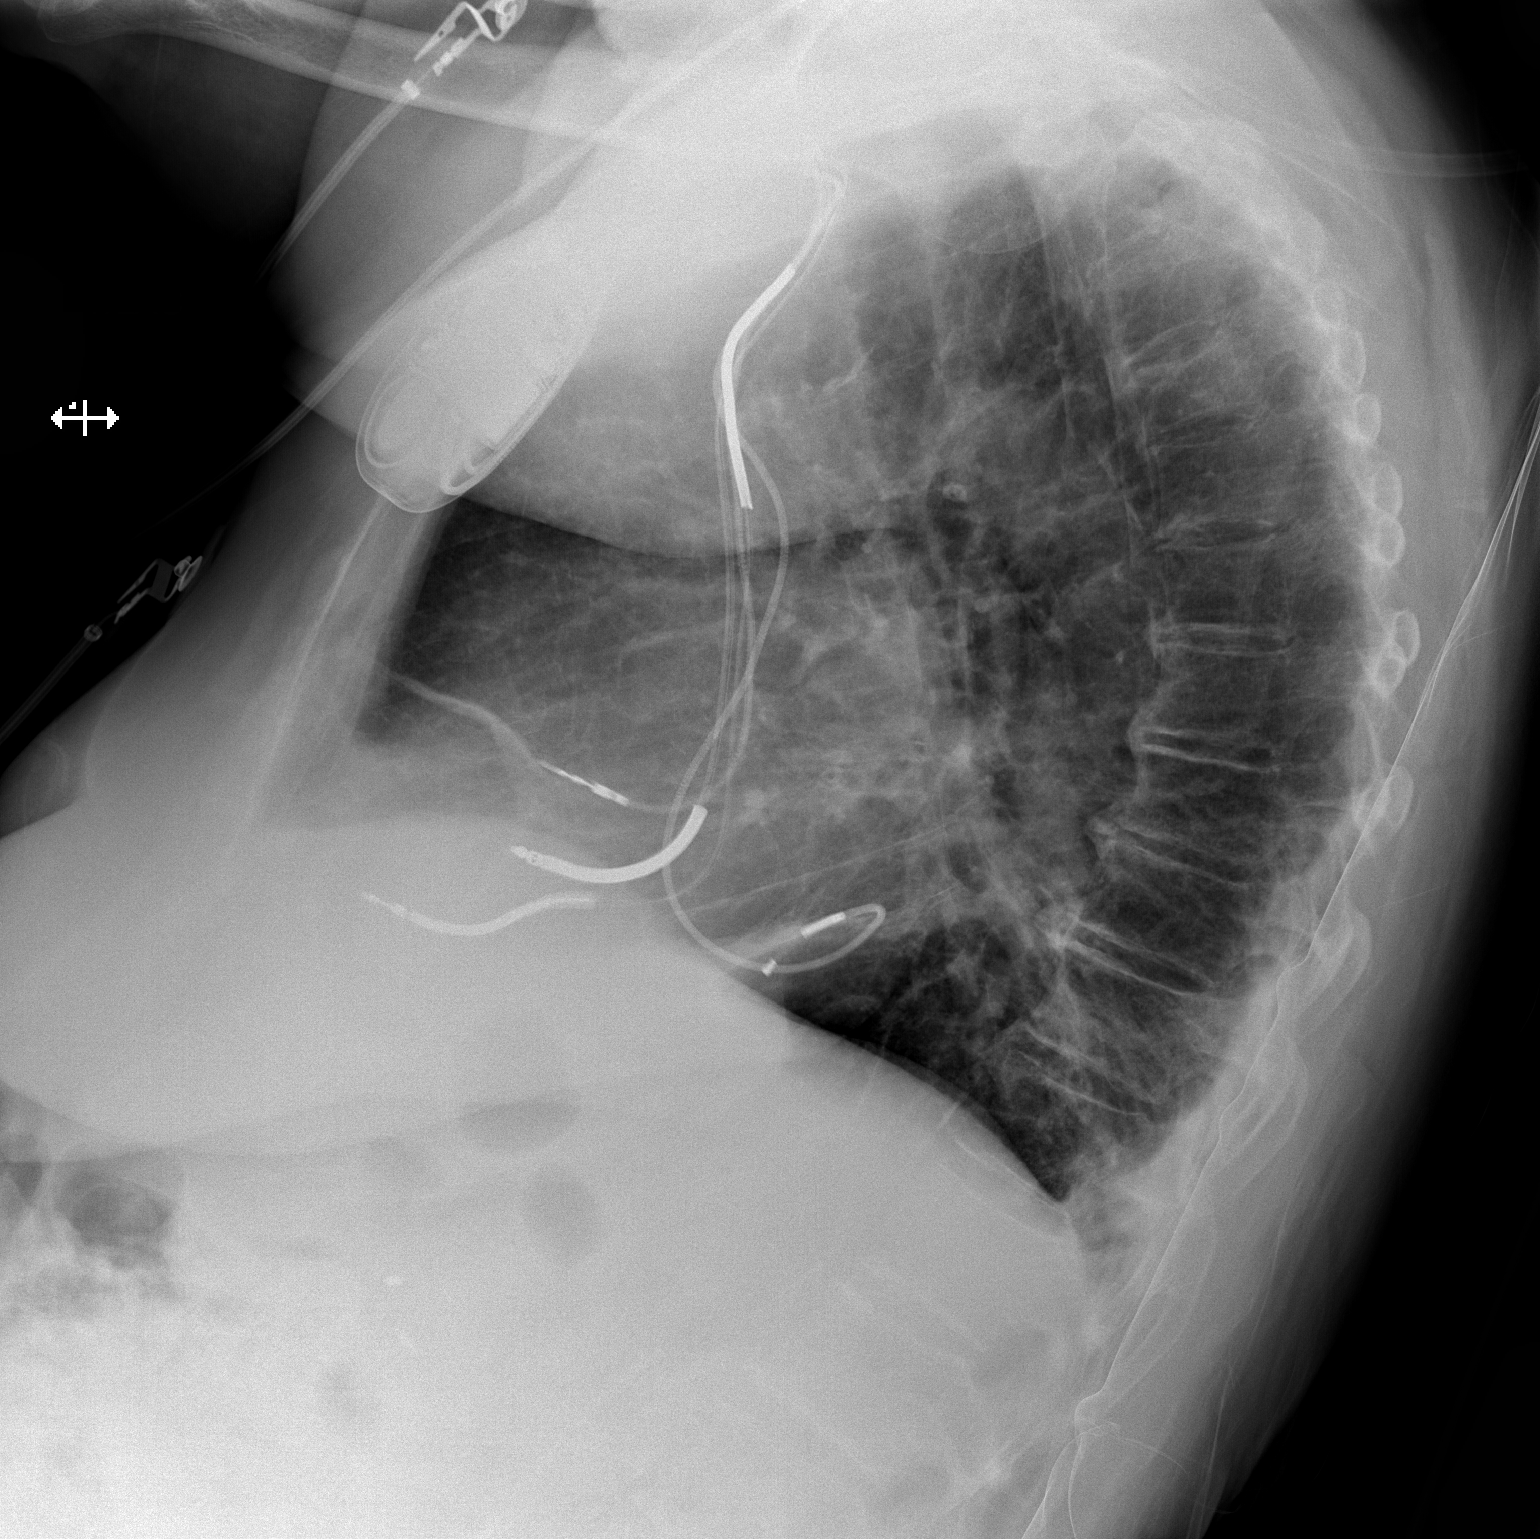

[2 of 2 positions shown; findings below may reference images not displayed]

FINDINGS: The patient has taken a shallow inspiration. Minimal areas of
linear density project within the right lung base. No focal regions of
consolidation identified. A left sided pectoralis pacing unit is appreciated
with lead tips projecting in the region of the right atrium and ventricle.
The cardiac silhouette is within normal limits. The visualized bony skeleton
is unremarkable.
IMPRESSION: Atelectasis versus infiltrate within the right lung base.

## 2014-10-04 IMAGING — CR DG CHEST 2V
1 series · 3 of 3 positions shown · non-contrast
Comparison: none

REASON FOR EXAM: Shortness of Breath
COMMENTS:   May transport without cardiac monitor

[Series 3: x chest ap · 0.14mm/px · 3 of 3 slices shown]
[im 1/3]
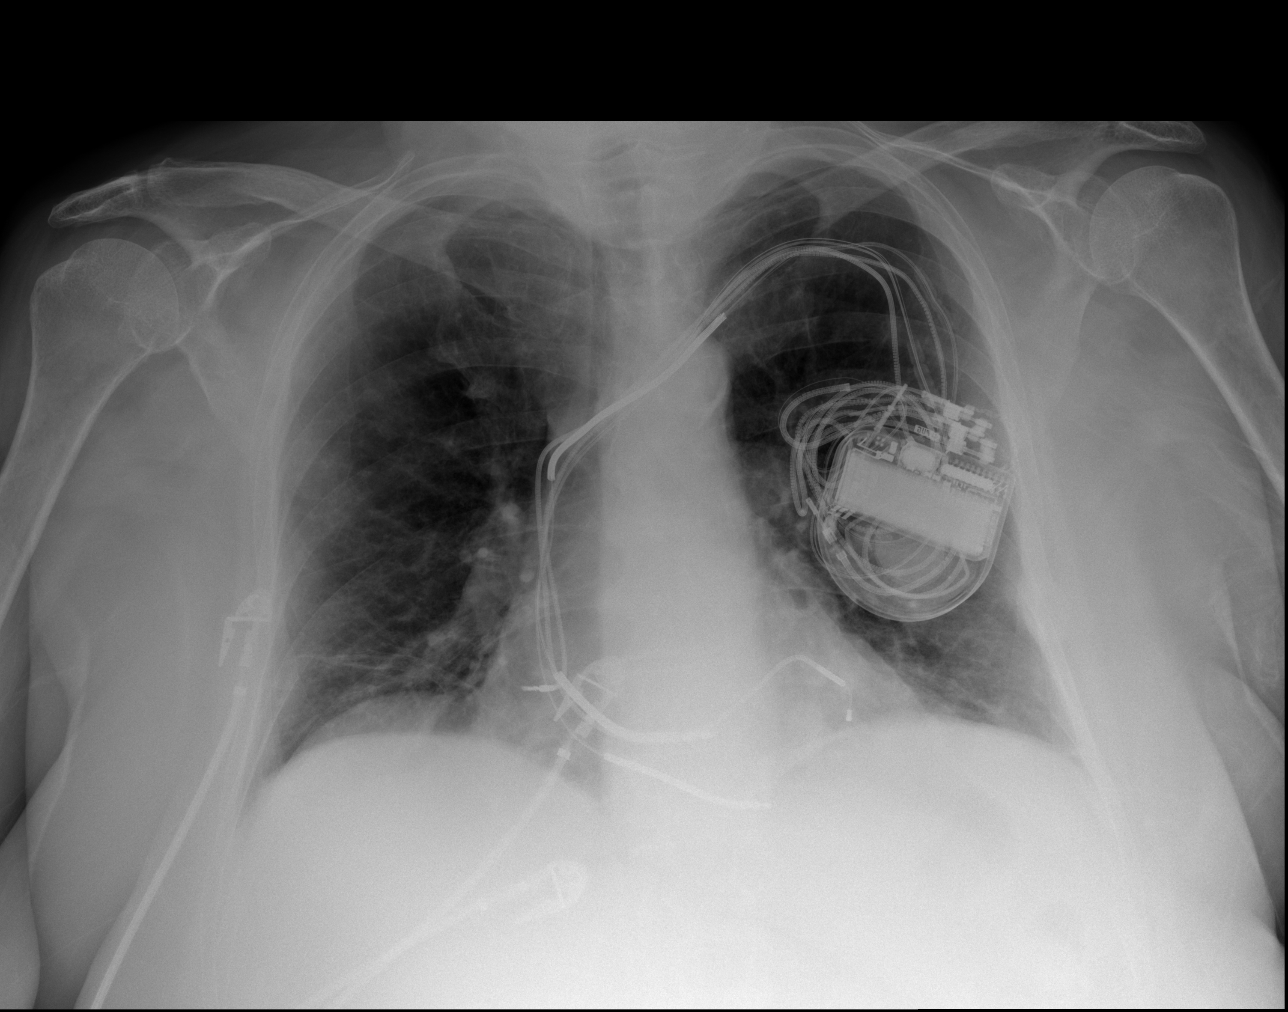
[im 2/3]
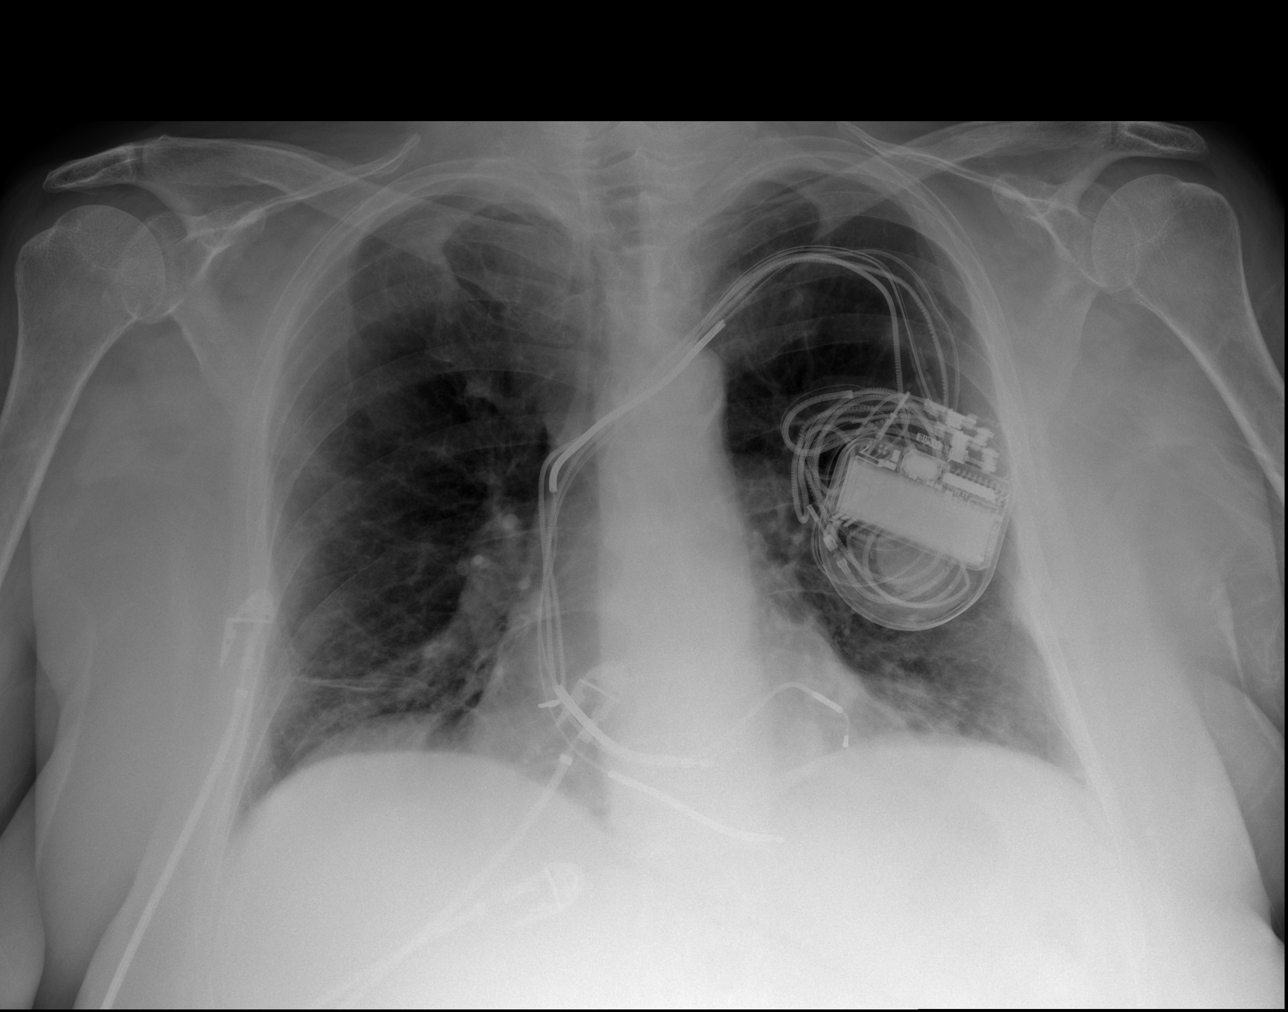
[im 3/3]
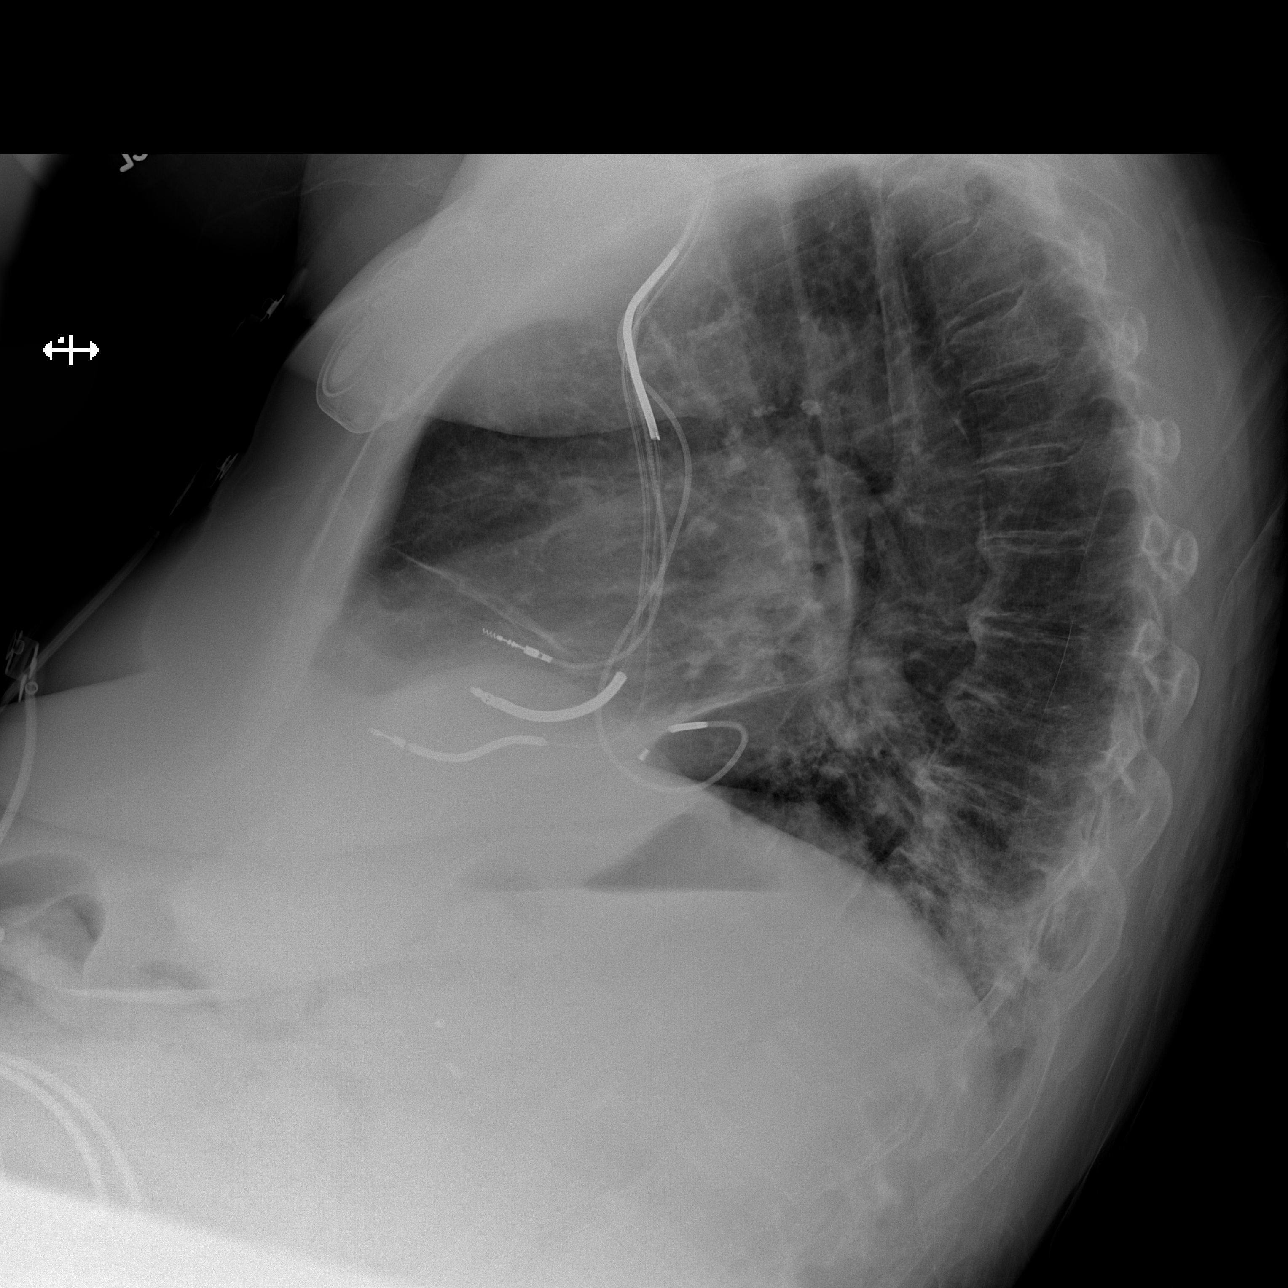

[3 of 3 positions shown; findings below may reference images not displayed]

PROCEDURE:     DXR - DXR CHEST PA (OR AP) AND LATERAL  - February 26, 2013  [DATE]

RESULT:     Comparison is made to the study of 02/13/2013. There is a
automatic pacemaker defibrillator device present. There is curvilinear
density at the right lung base suggestive of fibrosis versus atelectasis.
There is no focal consolidation, diffuse edema, cardiomegaly or significant
early fusion. There is no pneumothorax. The bony structures are
unremarkable. Atherosclerotic calcification is present.
IMPRESSION: Persistent density at the lung base on the right concerning
for infiltrate versus atelectasis.

[REDACTED]

## 2014-10-05 IMAGING — CR DG CHEST 1V PORT
1 series · 1 of 1 positions shown · non-contrast
Comparison: none

REASON FOR EXAM: r/o chf
COMMENTS:

[ap]
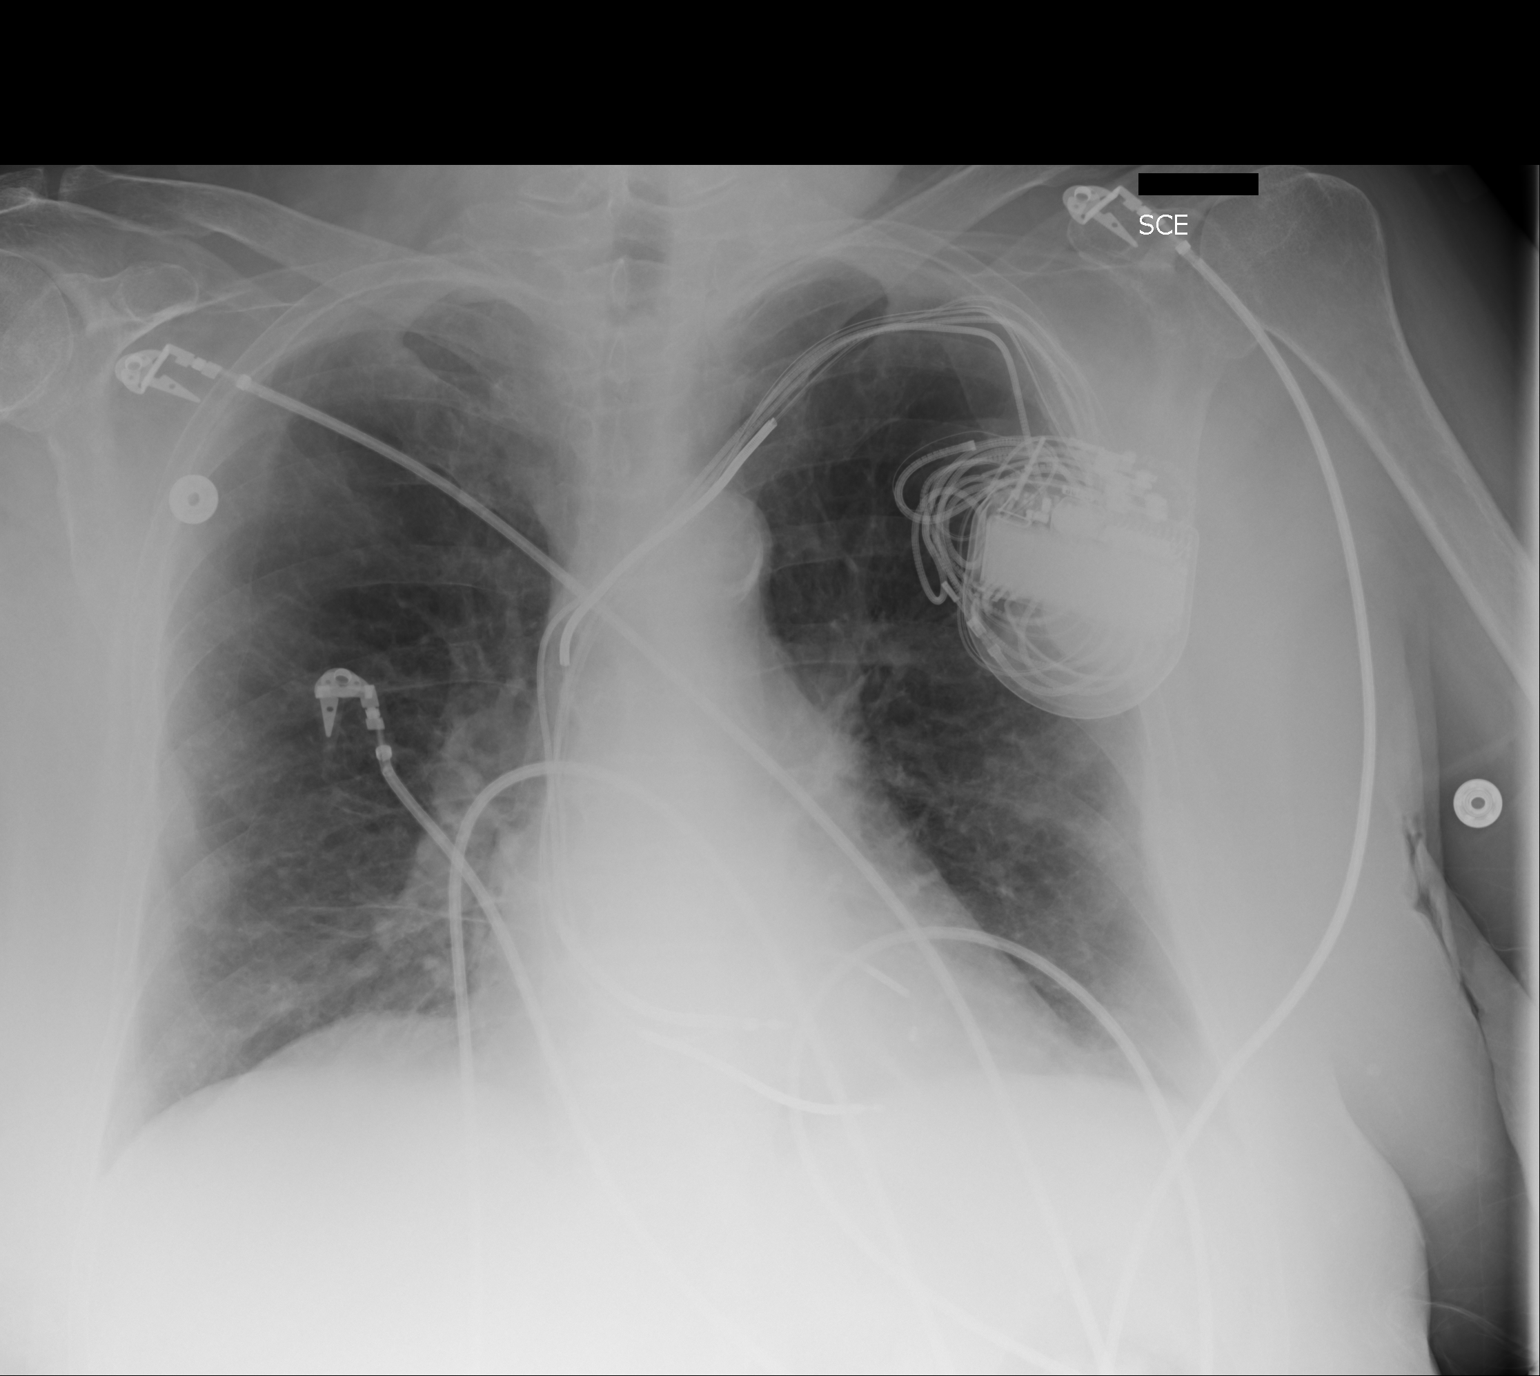

[1 of 1 positions shown; findings below may reference images not displayed]

PROCEDURE:     DXR - DXR PORTABLE CHEST SINGLE VIEW  - February 27, 2013  [DATE]

RESULT:     Comparison is made to the study of 02/26/2013. Left-sided
pacemaker/defibrillator is present. The lungs are hyperinflated consistent
with COPD. The cardiac silhouette is normal in appearance. Cardiac
monitoring electrodes are present. There is no edema, infiltrate, effusion,
mass or pneumothorax evident on this single projection.
IMPRESSION: 1. COPD. Pacemaker cerebral present. No acute cardiopulmonary disease
evident.

[REDACTED]

## 2014-10-05 IMAGING — CR DG CHEST 1V PORT
1 series · 1 of 1 positions shown · non-contrast
Comparison: none

REASON FOR EXAM: Central line placement
COMMENTS:

PROCEDURE:     DXR - DXR PORTABLE CHEST SINGLE VIEW  - February 27, 2013  [DATE]
RESULT:     Comparison: 02/27/2013, 02/26/2013

[ap]
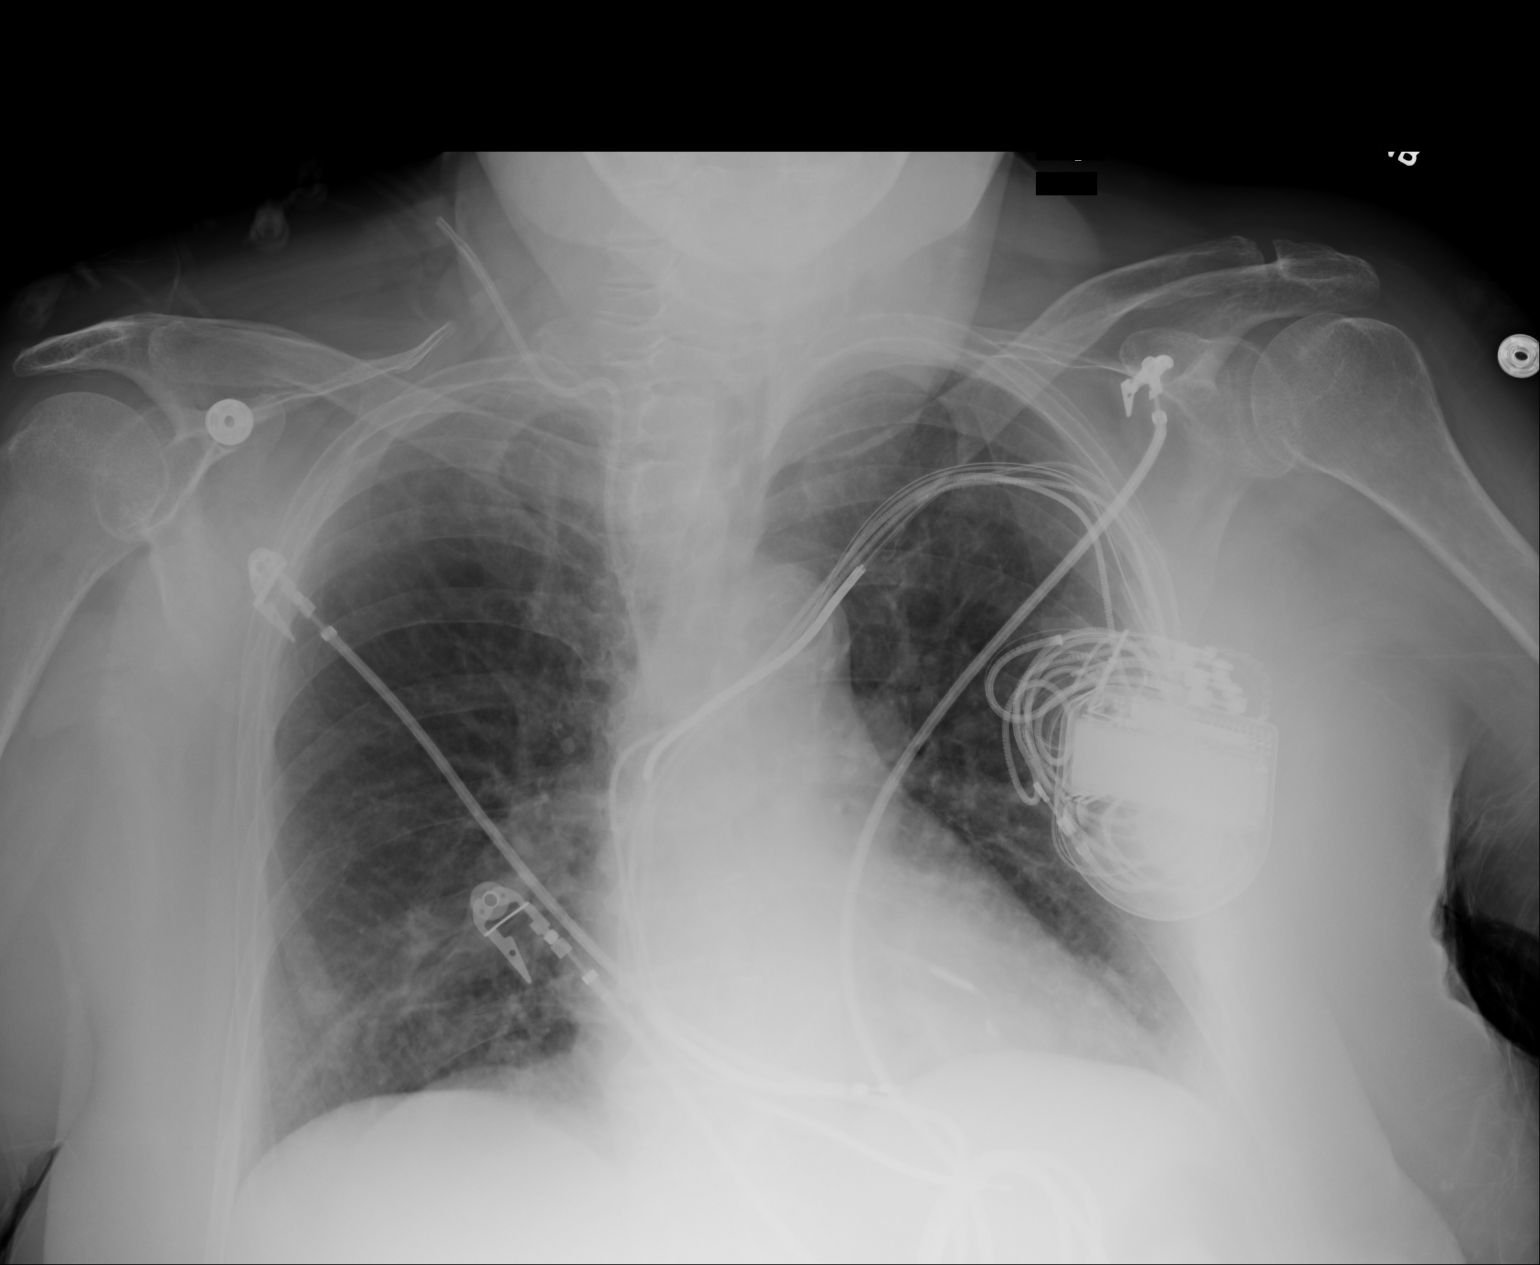

[1 of 1 positions shown; findings below may reference images not displayed]

FINDINGS: Single portable AP chest radiograph is provided. There is a right-sided
central venous catheter with the tip projecting over the SVC. There is hazy
right lower lobe airspace disease which may resent atelectasis versus
developing infiltrate. There is no pleural effusion, or pneumothorax. Normal
cardiomediastinal silhouette. The osseous structures are unremarkable.
IMPRESSION: Please see above.

[REDACTED]
